# Patient Record
Sex: Female | Born: 1970 | Race: Black or African American | Hispanic: No | Smoking: Never smoker
Health system: Southern US, Community
[De-identification: ages and names within clinical notes are randomized; demographics above are authoritative.]

## PROBLEM LIST (undated history)

## (undated) DIAGNOSIS — I10 Essential (primary) hypertension: Secondary | ICD-10-CM

---

## 1997-12-07 ENCOUNTER — Other Ambulatory Visit: Admission: RE | Admit: 1997-12-07 | Discharge: 1997-12-07 | Payer: Self-pay | Admitting: *Deleted

## 1997-12-21 ENCOUNTER — Other Ambulatory Visit: Admission: RE | Admit: 1997-12-21 | Discharge: 1997-12-21 | Payer: Self-pay | Admitting: *Deleted

## 1998-01-02 ENCOUNTER — Emergency Department (HOSPITAL_COMMUNITY): Admission: EM | Admit: 1998-01-02 | Discharge: 1998-01-02 | Payer: Self-pay | Admitting: Emergency Medicine

## 1998-02-10 ENCOUNTER — Other Ambulatory Visit: Admission: RE | Admit: 1998-02-10 | Discharge: 1998-02-10 | Payer: Self-pay | Admitting: *Deleted

## 1998-05-02 ENCOUNTER — Encounter: Admission: RE | Admit: 1998-05-02 | Discharge: 1998-05-02 | Payer: Self-pay | Admitting: *Deleted

## 2000-07-16 ENCOUNTER — Emergency Department (HOSPITAL_COMMUNITY): Admission: EM | Admit: 2000-07-16 | Discharge: 2000-07-16 | Payer: Self-pay

## 2001-02-01 ENCOUNTER — Emergency Department (HOSPITAL_COMMUNITY): Admission: EM | Admit: 2001-02-01 | Discharge: 2001-02-01 | Payer: Self-pay | Admitting: Emergency Medicine

## 2003-02-23 ENCOUNTER — Encounter: Payer: Self-pay | Admitting: Family Medicine

## 2003-02-23 ENCOUNTER — Ambulatory Visit (HOSPITAL_COMMUNITY): Admission: RE | Admit: 2003-02-23 | Discharge: 2003-02-23 | Payer: Self-pay | Admitting: Family Medicine

## 2003-05-04 ENCOUNTER — Ambulatory Visit (HOSPITAL_COMMUNITY): Admission: RE | Admit: 2003-05-04 | Discharge: 2003-05-04 | Payer: Self-pay | Admitting: Family Medicine

## 2003-05-04 ENCOUNTER — Encounter: Payer: Self-pay | Admitting: Family Medicine

## 2003-08-03 ENCOUNTER — Emergency Department (HOSPITAL_COMMUNITY): Admission: EM | Admit: 2003-08-03 | Discharge: 2003-08-03 | Payer: Self-pay | Admitting: Emergency Medicine

## 2005-11-21 ENCOUNTER — Emergency Department (HOSPITAL_COMMUNITY): Admission: EM | Admit: 2005-11-21 | Discharge: 2005-11-21 | Payer: Self-pay | Admitting: Family Medicine

## 2005-12-26 ENCOUNTER — Emergency Department (HOSPITAL_COMMUNITY): Admission: EM | Admit: 2005-12-26 | Discharge: 2005-12-26 | Payer: Self-pay | Admitting: Family Medicine

## 2006-03-27 ENCOUNTER — Emergency Department (HOSPITAL_COMMUNITY): Admission: EM | Admit: 2006-03-27 | Discharge: 2006-03-27 | Payer: Self-pay | Admitting: Emergency Medicine

## 2006-10-22 ENCOUNTER — Emergency Department (HOSPITAL_COMMUNITY): Admission: EM | Admit: 2006-10-22 | Discharge: 2006-10-22 | Payer: Self-pay | Admitting: Family Medicine

## 2006-12-21 ENCOUNTER — Emergency Department (HOSPITAL_COMMUNITY): Admission: EM | Admit: 2006-12-21 | Discharge: 2006-12-21 | Payer: Self-pay | Admitting: Emergency Medicine

## 2008-01-26 ENCOUNTER — Encounter (INDEPENDENT_AMBULATORY_CARE_PROVIDER_SITE_OTHER): Payer: Self-pay | Admitting: Obstetrics and Gynecology

## 2008-01-26 ENCOUNTER — Ambulatory Visit (HOSPITAL_COMMUNITY): Admission: RE | Admit: 2008-01-26 | Discharge: 2008-01-26 | Payer: Self-pay | Admitting: Obstetrics and Gynecology

## 2008-09-19 ENCOUNTER — Ambulatory Visit: Payer: Self-pay | Admitting: Internal Medicine

## 2008-09-19 DIAGNOSIS — I10 Essential (primary) hypertension: Secondary | ICD-10-CM | POA: Insufficient documentation

## 2008-09-19 DIAGNOSIS — F329 Major depressive disorder, single episode, unspecified: Secondary | ICD-10-CM | POA: Insufficient documentation

## 2008-09-19 DIAGNOSIS — F3289 Other specified depressive episodes: Secondary | ICD-10-CM | POA: Insufficient documentation

## 2008-09-19 DIAGNOSIS — R0989 Other specified symptoms and signs involving the circulatory and respiratory systems: Secondary | ICD-10-CM | POA: Insufficient documentation

## 2008-09-19 DIAGNOSIS — G4710 Hypersomnia, unspecified: Secondary | ICD-10-CM | POA: Insufficient documentation

## 2008-09-19 DIAGNOSIS — G43009 Migraine without aura, not intractable, without status migrainosus: Secondary | ICD-10-CM | POA: Insufficient documentation

## 2008-09-19 DIAGNOSIS — R079 Chest pain, unspecified: Secondary | ICD-10-CM | POA: Insufficient documentation

## 2008-09-19 DIAGNOSIS — R0609 Other forms of dyspnea: Secondary | ICD-10-CM | POA: Insufficient documentation

## 2008-09-19 DIAGNOSIS — D509 Iron deficiency anemia, unspecified: Secondary | ICD-10-CM | POA: Insufficient documentation

## 2008-09-19 LAB — CONVERTED CEMR LAB
ALT: 17 units/L (ref 0–35)
AST: 16 units/L (ref 0–37)
Albumin: 3.7 g/dL (ref 3.5–5.2)
Alkaline Phosphatase: 80 units/L (ref 39–117)
BUN: 10 mg/dL (ref 6–23)
Basophils Absolute: 0 10*3/uL (ref 0.0–0.1)
Basophils Relative: 0.8 % (ref 0.0–3.0)
Bilirubin Urine: NEGATIVE
Bilirubin, Direct: 0.1 mg/dL (ref 0.0–0.3)
CO2: 31 meq/L (ref 19–32)
Calcium: 9.4 mg/dL (ref 8.4–10.5)
Chloride: 104 meq/L (ref 96–112)
Cholesterol: 152 mg/dL (ref 0–200)
Creatinine, Ser: 0.6 mg/dL (ref 0.4–1.2)
Eosinophils Absolute: 0 10*3/uL (ref 0.0–0.7)
Eosinophils Relative: 0.5 % (ref 0.0–5.0)
GFR calc Af Amer: 145 mL/min
GFR calc non Af Amer: 120 mL/min
Glucose, Bld: 81 mg/dL (ref 70–99)
HCT: 36.3 % (ref 36.0–46.0)
HDL: 39 mg/dL (ref >39.0)
Hemoglobin, Urine: NEGATIVE
Hemoglobin: 12.2 g/dL (ref 12.0–15.0)
Ketones, ur: NEGATIVE mg/dL
LDL Cholesterol: 98 mg/dL (ref 0–99)
Leukocytes, UA: NEGATIVE
Lymphocytes Relative: 46.3 % — ABNORMAL HIGH (ref 12.0–46.0)
MCHC: 33.5 g/dL (ref 30.0–36.0)
MCV: 78.1 fL (ref 78.0–100.0)
Monocytes Absolute: 0.5 10*3/uL (ref 0.1–1.0)
Monocytes Relative: 10.4 % (ref 3.0–12.0)
Neutro Abs: 2 10*3/uL (ref 1.4–7.7)
Neutrophils Relative %: 42 % — ABNORMAL LOW (ref 43.0–77.0)
Nitrite: NEGATIVE
Platelets: 345 10*3/uL (ref 150–400)
Potassium: 4 meq/L (ref 3.5–5.1)
RBC: 4.65 M/uL (ref 3.87–5.11)
RDW: 15.2 % — ABNORMAL HIGH (ref 11.5–14.6)
Sodium: 139 meq/L (ref 135–145)
Specific Gravity, Urine: 1.02 (ref 1.000–1.03)
TSH: 0.57 microintl units/mL (ref 0.35–5.50)
Total Bilirubin: 0.7 mg/dL (ref 0.3–1.2)
Total CHOL/HDL Ratio: 3.9
Total Protein, Urine: NEGATIVE mg/dL
Total Protein: 7.9 g/dL (ref 6.0–8.3)
Triglycerides: 75 mg/dL (ref 0–149)
Urine Glucose: NEGATIVE mg/dL
Urobilinogen, UA: 1 (ref 0.0–1.0)
VLDL: 15 mg/dL (ref 0–40)
WBC: 4.7 10*3/uL (ref 4.5–10.5)
pH: 6 (ref 5.0–8.0)

## 2008-09-28 ENCOUNTER — Ambulatory Visit: Payer: Self-pay

## 2008-09-28 ENCOUNTER — Encounter: Payer: Self-pay | Admitting: Internal Medicine

## 2008-09-28 DIAGNOSIS — I428 Other cardiomyopathies: Secondary | ICD-10-CM | POA: Insufficient documentation

## 2008-10-03 ENCOUNTER — Ambulatory Visit: Payer: Self-pay

## 2008-10-10 ENCOUNTER — Ambulatory Visit: Payer: Self-pay | Admitting: Cardiology

## 2008-10-17 ENCOUNTER — Ambulatory Visit: Payer: Self-pay | Admitting: Gastroenterology

## 2008-10-24 ENCOUNTER — Ambulatory Visit (HOSPITAL_COMMUNITY): Admission: RE | Admit: 2008-10-24 | Discharge: 2008-10-24 | Payer: Self-pay | Admitting: Internal Medicine

## 2008-10-24 ENCOUNTER — Ambulatory Visit: Payer: Self-pay | Admitting: Internal Medicine

## 2008-10-25 ENCOUNTER — Ambulatory Visit: Payer: Self-pay | Admitting: Cardiology

## 2008-11-14 ENCOUNTER — Ambulatory Visit: Payer: Self-pay | Admitting: Internal Medicine

## 2008-11-14 DIAGNOSIS — I119 Hypertensive heart disease without heart failure: Secondary | ICD-10-CM | POA: Insufficient documentation

## 2008-11-28 ENCOUNTER — Ambulatory Visit: Payer: Self-pay

## 2008-11-28 ENCOUNTER — Ambulatory Visit: Payer: Self-pay | Admitting: Cardiology

## 2008-11-28 LAB — CONVERTED CEMR LAB
BUN: 12 mg/dL (ref 6–23)
CO2: 27 meq/L (ref 19–32)
Calcium: 9.2 mg/dL (ref 8.4–10.5)
Chloride: 109 meq/L (ref 96–112)
Creatinine, Ser: 0.8 mg/dL (ref 0.4–1.2)
GFR calc non Af Amer: 104.14 mL/min (ref >60)
Glucose, Bld: 110 mg/dL — ABNORMAL HIGH (ref 70–99)
Potassium: 3.9 meq/L (ref 3.5–5.1)
Sodium: 139 meq/L (ref 135–145)

## 2008-12-09 ENCOUNTER — Telehealth: Payer: Self-pay | Admitting: Cardiology

## 2008-12-09 ENCOUNTER — Telehealth (INDEPENDENT_AMBULATORY_CARE_PROVIDER_SITE_OTHER): Payer: Self-pay | Admitting: *Deleted

## 2008-12-29 ENCOUNTER — Encounter (INDEPENDENT_AMBULATORY_CARE_PROVIDER_SITE_OTHER): Payer: Self-pay | Admitting: *Deleted

## 2009-01-16 ENCOUNTER — Ambulatory Visit: Payer: Self-pay

## 2009-01-16 ENCOUNTER — Encounter: Payer: Self-pay | Admitting: Cardiology

## 2009-01-18 ENCOUNTER — Telehealth (INDEPENDENT_AMBULATORY_CARE_PROVIDER_SITE_OTHER): Payer: Self-pay | Admitting: *Deleted

## 2009-01-26 ENCOUNTER — Encounter: Admission: RE | Admit: 2009-01-26 | Discharge: 2009-04-26 | Payer: Self-pay | Admitting: Cardiology

## 2009-02-21 ENCOUNTER — Encounter: Payer: Self-pay | Admitting: Cardiology

## 2010-09-16 ENCOUNTER — Encounter: Payer: Self-pay | Admitting: Cardiology

## 2010-09-17 ENCOUNTER — Encounter: Payer: Self-pay | Admitting: Cardiology

## 2011-01-08 NOTE — Assessment & Plan Note (Signed)
[facility]                            CARDIOLOGY OFFICE NOTE   Michele Cole, Michele Cole                      MRN:          [id number]  DATE:10/10/2008                            DOB:          1970-10-04    ADDENDUM   ASSESSMENT AND PLAN:  1. Daytime sleepiness and morning headache.  The patient does have a      body habitus suggestive of obstructive sleep apnea.  She does snore      loudly at night and has daytime sleepiness and morning headache.  I      am going to set her up to get a sleep study.  If she does have      sleep apnea, I suggest her to be on CPAP, this may help with her      blood pressure also.     Marca Ancona, MD     DM/MedQ  DD: 10/10/2008  DT: 10/10/2008  Job #: [id number]

## 2011-01-08 NOTE — Assessment & Plan Note (Signed)
[facility]                            CARDIOLOGY OFFICE NOTE   Michele, Cole                      MRN:          [id number]  DATE:10/10/2008                            DOB:          09/06/1970    PRIMARY CARE PHYSICIAN:  Corwin Levins, MD at [facility], [place].   HISTORY OF PRESENT ILLNESS:  This is a 40 year old with a history of  morbid obesity, hypertension, and depression who presents for evaluation  of atypical chest pain and shortness of breath.  She has had a recent  echocardiogram and adenosine Myoview.  The patient states that for a  long period of time, she has had some shortness of breath.  She has 3  flights of stairs to climb to her apartment.  She is quite short of  breath by the time she gets to the top.  She is not short of breath  walking on a flat ground.  However, if she has to go up a hill she does  get considerably short of breath.  She also had 2 episodes of left-sided  chest burning and shortness of breath.  They were quite severe, 1 was in  September 2009 and the other was in December 2009, both times she was in  a meeting and was under a significant amount of stress.  Both of these  lasted only for a few seconds and were associated with tingling of her  left side.  She has had no further episodes.  She did have an adenosine  Myoview that showed EF of 54% with breast shadow, but no evidence of  scar or ischemia.  She had an echocardiogram that also showed an EF of  50-55% with mild global hypokinesis and abnormal septal motion.  The RV  was normal in size and function.  There were are no significant valvular  abnormalities.  The patient does say that she has not been exercising  lately.  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  She did go over to [employer] to an informational meeting about  the Lap-Band procedure.  However, apparently you have to have obesity  documented for 5 years and she has  only been seeing a doctor for a  couple of years.  She does have headaches in the morning and feels tired  and sleepy during the day.  She says she has been snoring loudly at  night for the last few years.   PAST MEDICAL HISTORY:  1. Hypertension.  2. Morbid obesity.  3. 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  4. Depression.  5. Migraines.  6. Echocardiogram done of February 2010 showed EF of 50-55% with mild      global hyperkinesis.  There appeared to be abnormal septal motion.      The RV appeared normal in size and function.  There were no      significant valvular abnormalities.  7. The patient had adenosine Myoview in February 2010, EF was 54%,      there was breast shadow.  There was  no evidence of scar or      ischemia.   SOCIAL HISTORY:  The patient is divorced.  She has 2 children.  She  works for the [employer].  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  She does not smoke and does not using  alcohol.   FAMILY HISTORY:  The patient's mother died of coronary artery disease in  the age of 38.  She actually had her first MI at 30.  Father had 2 heart  attacks.  She is not sure what age.  She has 3 siblings, were healthy  except for hypertension.   REVIEW OF SYSTEMS:  Negative except as noted in the history of present  illness.   LABORATORY DATA:  On February 2010, LDL 98, HDL 39, hematocrit 36.3, and  creatinine 0.6.  TSH is normal.  LFTs are also normal.   PHYSICAL EXAMINATION:  VITAL SIGNS:  Blood pressure is 146/105, heart  rate is 72 and regular, weight is 382 pounds.  GENERAL:  This is an obese female in no apparent distress.  NEUROLOGIC:  Alert and oriented x3.  LUNGS:  Clear to auscultation bilaterally.  Normal respiratory effort.  CARDIOVASCULAR:  Heart is regular.  S1 and S2.  No S3.  No S4.  There is  no murmur.  EXTREMITIES:  There is 1+ peripheral edema about a quarter of the way up  to lower legs bilaterally.  There are  2+ posterior tibial pulses  bilaterally.  There is no carotid bruit.  NECK:  There is no JVD.  There is no thyromegaly or thyroid nodule.  HEENT:  Normal exam.  ABDOMEN:  Soft, obese, and nontender.  No hepatosplenomegaly.  Normal  bowel sounds.  EXTREMITIES:  There is no clubbing or cyanosis.  MUSCULOSKELETAL:  Normal exam.  SKIN:  Normal exam.   KG was reviewed shows normal sinus rhythm.  There is poor anterior R-  wave progression.   ASSESSMENT AND PLAN:  This is a 40 year old morbidly obese female with  hypertension who presents for evaluation of shortness of breath and  atypical chest pain.  1. Atypical chest pain.  The patient does have risk factors including      hypertension and family history of early onset coronary artery      disease.  However, her chest pain was quite atypical and associated      with emotional stress.  I do think her chest pain is probably      noncardiac and she had an adenosine Myoview recently that showed no      evidence of scar or ischemia.  Given her early family history, a      reasonable goal for her LDL would be less than 100 and her LDL is      98.  The next time she has lipids checked, I do think it would be      reasonable to check a lipoprotein A.  If that is elevated, I would      treat her LDL to an even lower level.  2. Hypertension.  The patient's blood pressure continues to be      elevated.  It has been elevated at her primary care physician's      office.  I am going to go ahead and start her on medication today.      We will begin Norvasc at 5 mg daily.  We will have her back in 2  weeks for blood pressure check.  3. Dyspnea on exertion.  I do think that the patient's dyspnea on      exertion is mainly due to her weight.  She weighs 382 pounds and is      morbidly obese.  I did talk to her at length about strategies for      diet and exercise, it is difficult for her to exercise as her life      is quite busy.  She is in a  school at [employer] and she is also      working.  She does drink a lot of soft drinks and eats fast food.      I did tell her she needs to cut those both out completely.  I do      think something like a Lap-Band or gastric bypass maybe beneficial      for her especially given her risk factors for having significant      chronic problems developing as a result of her obesity in the      future.  I did tell her that it would be quite reasonable to look      into the Lap-Band, although she is discouraged right now because      they say that you have to have obesity documented for 5 years.  4. Abnormal echocardiogram.  The patient's left ventricular function      was mildly depressed on her echocardiogram.  Valvular function      appeared normal.  She has no evidence of coronary artery disease on      her Myoview.  I do think that her shortness of breath comes from      her obesity rather than from any heart problems.  We will give her      3 months for treat her blood pressure and we will do a cardiac MRI      to quantitatively assess her left ventricular function and also to      assess for any infiltrative disease although I doubt we will find      this.  We will see the patient back in 3 months after she gets her      MRI done.  5. Daytime sleepiness and morning headache.  The patient does have a      body habitus suggestive of obstructive sleep apnea.  She does snore      loudly at night and has daytime sleepiness and morning headache.  I      am going to set her up to get a sleep study.  If she does have      sleep apnea, I would suggest for her to be on CPAP, this may help      with her blood pressure also.     Marca Ancona, MD  Electronically Signed    DM/MedQ  DD: 10/10/2008  DT: 10/10/2008  Job #: [id number]   cc:   Corwin Levins, MD

## 2011-01-08 NOTE — Op Note (Signed)
Michele Cole, Michele Cole               ACCOUNT NO.:  [id number]   MEDICAL RECORD NO.:  [id number]           PATIENT TYPE:   LOCATION:                                [facility]   PHYSICIAN:  Lenoard Aden, M.D.     DATE OF BIRTH:   DATE OF PROCEDURE:  01/26/2008  DATE OF DISCHARGE:  01/26/2008                               OPERATIVE REPORT   PREOPERATIVE DIAGNOSES:  Dysmenorrhea and menorrhagia.   POSTOPERATIVE DIAGNOSES:  Dysmenorrhea and menorrhagia.   PROCEDURE:  Diagnostic hysteroscopy, dilation and curettage, and  NovaSure endometrial ablation.   SURGEON:  Lenoard Aden, M.D.   ANESTHESIA:  General.   ESTIMATED BLOOD LOSS:  Less than 50 mL.   COMPLICATIONS:  None.   DRAINS:  None.   COUNTS:  Correct.   CONDITION:  The patient recovered in good condition.   FLUID DEFICIT:  55 mL.   BRIEF OPERATIVE NOTE:  After being apprised of risks of anesthesia,  infection, bleeding, injury to abdominal organs, need for repair,  delayed versus immediate, and  complications to include bowel and  bladder injury, the patient was brought to the operating where she was  administered general anesthetic without complications, prepped and  draped in sterile fashion, catheterized till the bladder was empty.  Exam under anesthesia reveals a small anteflexed uterus.  No adnexal  masses.  Uterus sounds to 9 cm, intracervical length of 3 cm.  Cervix  easily dilated with #21 Shawnie Pons dilator.  Hysteroscope placed after  placement of a dilute Pitressin solution at 3 and 9 o'clock at the  cervicovaginal junction, 16 mL total.  Visualization reveals a normal  endometrial cavity with thickened posterior wall.  Endometrial  curettings were collected with normal __________ quadrant method and  tissues with copious amounts of tissue noted, and sent to pathology.  Normal tubal ostia and normal cavity are revisualized.  NovaSure device  was placed.  Cavity width of 6 cm is presented.  CO2 test is  initiated  and is negative.  Procedure is then initiated for a cavity width of 4.3  for 65 seconds without complications.  Instrument is removed without  difficulty.  Revisualization reveals a well-ablated endometrial cavity and bilateral  good anterior and posterior wall coverage.  Instruments removed without  difficulty.  The patient tolerated the procedure well, and was awakened  and transferred to the recovery room in good condition.      Lenoard Aden, M.D.  Electronically Signed     RJT/MEDQ  D:  01/26/2008  T:  01/27/2008  Job:  [id number]

## 2011-01-08 NOTE — Op Note (Signed)
NAME:  Michele Cole, Michele Cole                  ACCOUNT NO.:  I l   MEDICAL RECORD NO.:  [id number]           PATIENT TYPE:   LOCATION:                                 FACILITY:   PHYSICIAN:  Lenoard Aden, M.D.     DATE OF BIRTH:   DATE OF PROCEDURE:  01/26/2008  DATE OF DISCHARGE:                               OPERATIVE REPORT   PREOPERATIVE DIAGNOSES:  Dysmenorrhea and menorrhagia.   POSTOPERATIVE DIAGNOSES:  Dysmenorrhea and menorrhagia.   PROCEDURE:  Diagnostic hysteroscopy, dilation and curettage, and  NovaSure endometrial ablation.   SURGEON:  Lenoard Aden, M.D.   ANESTHESIA:  General.   ESTIMATED BLOOD LOSS:  Less than 50 mL.   COMPLICATIONS:  None.   DRAINS:  None.   COUNTS:  Correct.   CONDITION:  The patient recovered in good condition.   FLUID DEFICIT:  55 mL.   BRIEF OPERATIVE NOTE:  After being apprised of risks of anesthesia,  infection, bleeding, injury to abdominal organs, need for repair,  delayed versus immediate, and  complications to include bowel and  bladder injury, the patient was brought to the operating where she was  administered general anesthetic without complications, prepped and  draped in sterile fashion, catheterized till the bladder was empty.  Exam under anesthesia reveals a small anteflexed uterus.  No adnexal  masses.  Uterus sounds to 9 cm, intracervical length of 3 cm.  Cervix  easily dilated with #21 Shawnie Pons dilator.  Hysteroscope placed after  placement of a dilute Pitressin solution at 3 and 9 o'clock at the  cervicovaginal junction, 16 mL total.  Visualization reveals a normal  endometrial cavity with thickened posterior wall.  Endometrial  curettings were collected with normal __________ quadrant method and  tissues with copious amounts of tissue noted, and sent to pathology.  Normal tubal ostia and normal cavity are revisualized.  NovaSure device  was placed.  Cavity width of 6 cm is presented.  CO2 test is initiated  and is  negative.  Procedure is then initiated for a cavity width of 4.3  for 65 seconds without complications.  Instrument is removed without  difficulty.  Revisualization reveals a well-ablated endometrial cavity and bilateral  good anterior and posterior wall coverage.  Instruments removed without  difficulty.  The patient tolerated the procedure well, and was awakened  and transferred to the recovery room in good condition.      Lenoard Aden, M.D.     RJT/MEDQ  D:  01/26/2008  T:  01/27/2008  Job:  [id number]

## 2011-05-23 LAB — CBC
HCT: 36
Hemoglobin: 11.9 — ABNORMAL LOW
MCHC: 32.9
MCV: 74.3 — ABNORMAL LOW
Platelets: 373
RBC: 4.84
RDW: 16.7 — ABNORMAL HIGH
WBC: 5.1

## 2011-05-23 LAB — PREGNANCY, URINE: Preg Test, Ur: NEGATIVE

## 2012-12-20 ENCOUNTER — Encounter (HOSPITAL_COMMUNITY): Payer: Self-pay | Admitting: *Deleted

## 2012-12-20 ENCOUNTER — Emergency Department (HOSPITAL_COMMUNITY)
Admission: EM | Admit: 2012-12-20 | Discharge: 2012-12-20 | Disposition: A | Discharge status: Home / Self Care | Payer: BC Managed Care – PPO | Attending: Emergency Medicine | Admitting: Emergency Medicine

## 2012-12-20 ENCOUNTER — Emergency Department (HOSPITAL_COMMUNITY): Payer: BC Managed Care – PPO

## 2012-12-20 DIAGNOSIS — R1084 Generalized abdominal pain: Secondary | ICD-10-CM | POA: Insufficient documentation

## 2012-12-20 DIAGNOSIS — Z79899 Other long term (current) drug therapy: Secondary | ICD-10-CM | POA: Insufficient documentation

## 2012-12-20 DIAGNOSIS — R197 Diarrhea, unspecified: Secondary | ICD-10-CM | POA: Insufficient documentation

## 2012-12-20 DIAGNOSIS — Z3202 Encounter for pregnancy test, result negative: Secondary | ICD-10-CM | POA: Insufficient documentation

## 2012-12-20 DIAGNOSIS — I10 Essential (primary) hypertension: Secondary | ICD-10-CM | POA: Insufficient documentation

## 2012-12-20 DIAGNOSIS — R112 Nausea with vomiting, unspecified: Secondary | ICD-10-CM | POA: Insufficient documentation

## 2012-12-20 HISTORY — DX: Essential (primary) hypertension: I10

## 2012-12-20 LAB — COMPREHENSIVE METABOLIC PANEL
ALT: 16 U/L (ref 0–35)
AST: 15 U/L (ref 0–37)
Albumin: 3.6 g/dL (ref 3.5–5.2)
Alkaline Phosphatase: 91 U/L (ref 39–117)
BUN: 10 mg/dL (ref 6–23)
CO2: 25 mEq/L (ref 19–32)
Calcium: 9.5 mg/dL (ref 8.4–10.5)
Chloride: 101 mEq/L (ref 96–112)
Creatinine, Ser: 0.55 mg/dL (ref 0.50–1.10)
GFR calc Af Amer: >90 mL/min (ref >90)
GFR calc non Af Amer: >90 mL/min (ref >90)
Glucose, Bld: 117 mg/dL — ABNORMAL HIGH (ref 70–99)
Potassium: 3.7 mEq/L (ref 3.5–5.1)
Sodium: 135 mEq/L (ref 135–145)
Total Bilirubin: 0.3 mg/dL (ref 0.3–1.2)
Total Protein: 8.3 g/dL (ref 6.0–8.3)

## 2012-12-20 LAB — OCCULT BLOOD, POC DEVICE: Fecal Occult Bld: POSITIVE — AB

## 2012-12-20 LAB — URINALYSIS, MICROSCOPIC ONLY
Bilirubin Urine: NEGATIVE
Glucose, UA: NEGATIVE mg/dL
Hgb urine dipstick: NEGATIVE
Ketones, ur: NEGATIVE mg/dL
Leukocytes, UA: NEGATIVE
Nitrite: NEGATIVE
Protein, ur: NEGATIVE mg/dL
Specific Gravity, Urine: 1.013 (ref 1.005–1.030)
Urobilinogen, UA: 0.2 mg/dL (ref 0.0–1.0)
pH: 7 (ref 5.0–8.0)

## 2012-12-20 LAB — CBC WITH DIFFERENTIAL/PLATELET
Basophils Absolute: 0 10*3/uL (ref 0.0–0.1)
Basophils Relative: 0 % (ref 0–1)
Eosinophils Absolute: 0 10*3/uL (ref 0.0–0.7)
Eosinophils Relative: 0 % (ref 0–5)
HCT: 38.5 % (ref 36.0–46.0)
Hemoglobin: 13 g/dL (ref 12.0–15.0)
Lymphocytes Relative: 24 % (ref 12–46)
Lymphs Abs: 1.5 10*3/uL (ref 0.7–4.0)
MCH: 26.4 pg (ref 26.0–34.0)
MCHC: 33.8 g/dL (ref 30.0–36.0)
MCV: 78.3 fL (ref 78.0–100.0)
Monocytes Absolute: 0.3 10*3/uL (ref 0.1–1.0)
Monocytes Relative: 4 % (ref 3–12)
Neutro Abs: 4.5 10*3/uL (ref 1.7–7.7)
Neutrophils Relative %: 72 % (ref 43–77)
Platelets: 372 10*3/uL (ref 150–400)
RBC: 4.92 MIL/uL (ref 3.87–5.11)
RDW: 14.5 % (ref 11.5–15.5)
WBC: 6.4 10*3/uL (ref 4.0–10.5)

## 2012-12-20 LAB — POCT PREGNANCY, URINE: Preg Test, Ur: NEGATIVE

## 2012-12-20 LAB — LIPASE, BLOOD: Lipase: 22 U/L (ref 11–59)

## 2012-12-20 MED ORDER — FENTANYL CITRATE 0.05 MG/ML IJ SOLN
50.0000 ug | Freq: Once | INTRAMUSCULAR | Status: AC
  Administered 2012-12-20: 50 ug via INTRAVENOUS
  Filled 2012-12-20: qty 2

## 2012-12-20 MED ORDER — HYDROCODONE-ACETAMINOPHEN 5-325 MG PO TABS: 1.0000 | ORAL_TABLET | ORAL | Status: DC | PRN

## 2012-12-20 MED ORDER — ONDANSETRON HCL 4 MG/2ML IJ SOLN
4.0000 mg | Freq: Once | INTRAMUSCULAR | Status: AC
  Administered 2012-12-20: 4 mg via INTRAVENOUS
  Filled 2012-12-20: qty 2

## 2012-12-20 MED ORDER — MORPHINE SULFATE 4 MG/ML IJ SOLN
6.0000 mg | Freq: Once | INTRAMUSCULAR | Status: AC
  Administered 2012-12-20: 6 mg via INTRAVENOUS
  Filled 2012-12-20: qty 2

## 2012-12-20 MED ORDER — IOHEXOL 300 MG/ML  SOLN
50.0000 mL | Freq: Once | INTRAMUSCULAR | Status: AC | PRN
  Administered 2012-12-20: 50 mL via ORAL

## 2012-12-20 MED ORDER — IOHEXOL 300 MG/ML  SOLN
100.0000 mL | Freq: Once | INTRAMUSCULAR | Status: AC | PRN
  Administered 2012-12-20: 100 mL via INTRAVENOUS

## 2012-12-20 MED ORDER — SODIUM CHLORIDE 0.9 % IV SOLN
1000.0000 mL | Freq: Once | INTRAVENOUS | Status: AC
  Administered 2012-12-20: 1000 mL via INTRAVENOUS

## 2012-12-20 MED ORDER — ONDANSETRON 8 MG PO TBDP: 8.0000 mg | ORAL_TABLET | Freq: Three times a day (TID) | ORAL | Status: DC | PRN

## 2012-12-20 NOTE — ED Notes (Signed)
Pt unable to tolerate fluids. Sts after taking a couple sips, she became nauseous and started having abd pain.

## 2012-12-20 NOTE — ED Provider Notes (Addendum)
History     CSN: [id number]  Arrival date & time 12/20/12  0911   First MD Initiated Contact with Patient 12/20/12 0932      Chief Complaint  Patient presents with  . Nausea  . Abdominal Pain  . Emesis     HPI Patient reports nausea vomiting diarrhea over the past 24 hours.  She does state that she saw a small amount of blood the nurse stool.  No melena described.  No fevers or chills.  Patient is concerned she may have gotten food poisoning.  No recent sick contacts.  She does report generalized crampy abdominal pain but reports more of the abdominal pain is located in her lower abdomen.  No urinary symptoms.  Nothing worsens or improves her symptoms.  Her pain is mild to moderate in severity   Past Medical History  Diagnosis Date  . Hypertension     Past Surgical History  Procedure Laterality Date  . Cesarean section      No family history on file.  History  Substance Use Topics  . Smoking status: Never Smoker   . Smokeless tobacco: Not on file  . Alcohol Use: No    OB History   Grav Para Term Preterm Abortions TAB SAB Ect Mult Living                  Review of Systems  All other systems reviewed and are negative.    Allergies  Review of patient's allergies indicates no known allergies.  Home Medications   Current Outpatient Rx  Name  Route  Sig  Dispense  Refill  . ibuprofen (ADVIL,MOTRIN) 200 MG tablet   Oral   Take 400-800 mg by mouth every 8 (eight) hours as needed for pain.         Marland Kitchen lisinopril-hydrochlorothiazide (PRINZIDE,ZESTORETIC) 20-12.5 MG per tablet   Oral   Take 1 tablet by mouth daily.           BP 155/89  Pulse 81  Temp(Src) 98.2 F (36.8 C) (Oral)  Resp 16  SpO2 98%  Physical Exam  Nursing note and vitals reviewed. Constitutional: She is oriented to person, place, and time. She appears well-developed and well-nourished. No distress.  HENT:  Head: Normocephalic and atraumatic.  Eyes: EOM are normal.  Neck: Normal  range of motion.  Cardiovascular: Normal rate, regular rhythm and normal heart sounds.   Pulmonary/Chest: Effort normal and breath sounds normal.  Abdominal: Soft. She exhibits no distension. There is no tenderness. There is no rebound and no guarding.  Genitourinary:  Nontender rectal exam.  No gross blood.  Brown stool.  Musculoskeletal: Normal range of motion.  Neurological: She is alert and oriented to person, place, and time.  Skin: Skin is warm and dry.  Psychiatric: She has a normal mood and affect. Judgment normal.    ED Course  Procedures (including critical care time)  Labs Reviewed  COMPREHENSIVE METABOLIC PANEL - Abnormal; Notable for the following:    Glucose, Bld 117 (*)    All other components within normal limits  URINALYSIS, MICROSCOPIC ONLY - Abnormal; Notable for the following:    Bacteria, UA FEW (*)    Squamous Epithelial / LPF FEW (*)    All other components within normal limits  OCCULT BLOOD, POC DEVICE - Abnormal; Notable for the following:    Fecal Occult Bld POSITIVE (*)    All other components within normal limits  CBC WITH DIFFERENTIAL  LIPASE, BLOOD  POCT PREGNANCY,  URINE   Ct Abdomen Pelvis W Contrast  12/20/2012  *RADIOLOGY REPORT*  Clinical Data: Nausea, vomiting, abdominal pain  CT ABDOMEN AND PELVIS WITH CONTRAST  Technique:  Multidetector CT imaging of the abdomen and pelvis was performed following the standard protocol during bolus administration of intravenous contrast.  Contrast: 50mL OMNIPAQUE IOHEXOL 300 MG/ML  SOLN, OMNIPAQUE IOHEXOL 300 MG/ML  SOLN  Comparison: None.  Findings:  Normal hepatic contour.  There is a minimal amount of focal fatty infiltration adjacent to the fissure for the ligamentum teres. Normal appearance of the gallbladder.  No intra or extrahepatic biliary duct dilatation.  No ascites.  There is symmetric enhancement of the kidneys.  There is mild persistent fetal lobulation of the bilateral kidneys.  No definite renal  stones on this postcontrast examination.  No discrete renal lesions.  No evidence of urinary obstruction or perinephric stranding.  Normal appearance of the bilateral adrenal glands, pancreas and spleen.  Incidental note is made of a small splenule.  Scattered colonic diverticulosis without evidence of diverticulitis.  There is a tiny linear appendicolith within the tip of otherwise normal appearing appendix (coronal image 62, series five).  No associated periappendiceal stranding.  No pneumoperitoneum, pneumatosis or portal venous gas.  Normal caliber of the abdominal aorta.  The major branch vessels of the abdominal aorta appear patent on this non CT examination.  No definite retroperitoneal, mesenteric, pelvic or inguinal lymphadenopathy.  Normal appearance of the pelvic organs for age.  Incidental note is made of an approximately 1.9 x 2.0 cm hypoattenuating right-sided adnexal cyst (image 80, series 2).  No free fluid in the pelvis.  Limited visualization of the lower thorax demonstrates minimal bibasilar subpleural atelectasis.  No focal airspace opacities. Normal heart size.  No pericardial effusion.  No acute or aggressive osseous abnormalities.  Mild DDD of L5 - S1 with disc space height loss and posterior disc osteophyte complex at this level.  IMPRESSION: 1.  No explanation for patient's abdominal pain, nausea and vomiting.  Specifically, no evidence of enteric urinary obstruction. 2.  A linear appendicolith is seen within the tip of an otherwise normal appearing appendix - nonspecific though in the absence of secondary findings is of doubtful clinical concern.  3.  Colonic diverticulosis without evidence of diverticulitis.   Original Report Authenticated By: Tacey Ruiz, MD    I personally reviewed the imaging tests through PACS system I reviewed available ER/hospitalization records through the EMR   1. Nausea vomiting and diarrhea       MDM  This patient's symptoms seem more consistent with  a viral gastroenteritis.  Given her mild lower abdominal discomfort a CT scan was obtained as she develop worsening pain and nausea with eating while in the emergency department.  CT scan is without acute pathology.  There is appendicolith noted however there is no surrounding stranding or inflammatory changes.  It's otherwise read out as a normal appendix.  The suspicion for appendicitis is low.  Patient will return to ER for new or worsening abdominal pain.  Home with nausea medicine a short course of pain medicine.        Lyanne Co, MD 12/20/12 1455  Lyanne Co, MD 12/20/12 [phone]

## 2012-12-20 NOTE — ED Notes (Signed)
Pt sts went out to eat yesterday and overnight developed nausea, diarrhea and vomiting. Pt sts she thinks she has food poisoning.

## 2013-09-30 ENCOUNTER — Encounter: Payer: Self-pay | Admitting: Internal Medicine

## 2013-11-20 IMAGING — CT CT ABD-PELV W/ CM
1 of 2 series · 14 of 32 positions shown, 18 images · IV contrast (omnipaque)
Comparison: None.

CLINICAL DATA: Nausea, vomiting, abdominal pain

CT ABDOMEN AND PELVIS WITH CONTRAST
TECHNIQUE: Multidetector CT imaging of the abdomen and pelvis was
performed following the standard protocol during bolus
administration of intravenous contrast.
Contrast: 50mL OMNIPAQUE IOHEXOL 300 MG/ML  SOLN, 100mL OMNIPAQUE
IOHEXOL 300 MG/ML  SOLN

[Series 2: abd/pel with · axial · 0.87mm/px · z∈[-487,-37]mm · 14 of 100 slices shown, 18 images]
[im 5/100  soft-tissue]
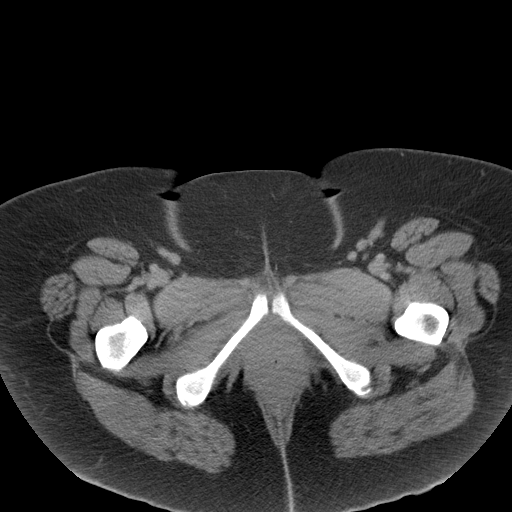
[im 5/100  bone]
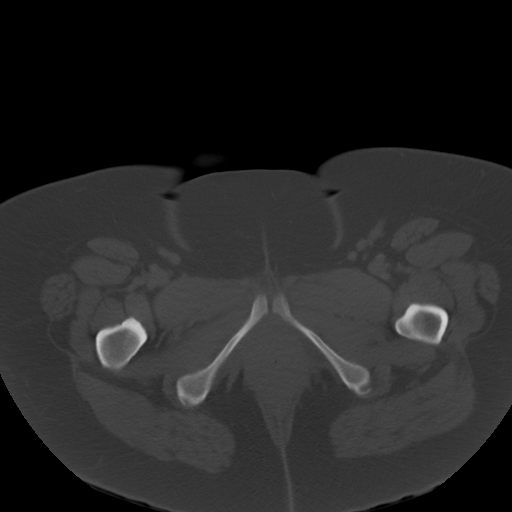
[im 13/100  soft-tissue]
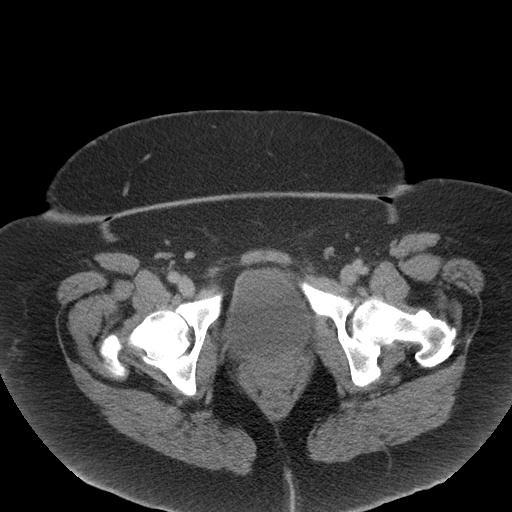
[im 21/100  soft-tissue]
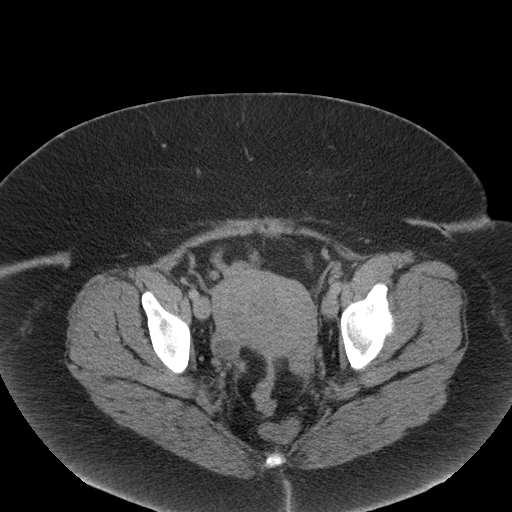
[im 29/100  soft-tissue]
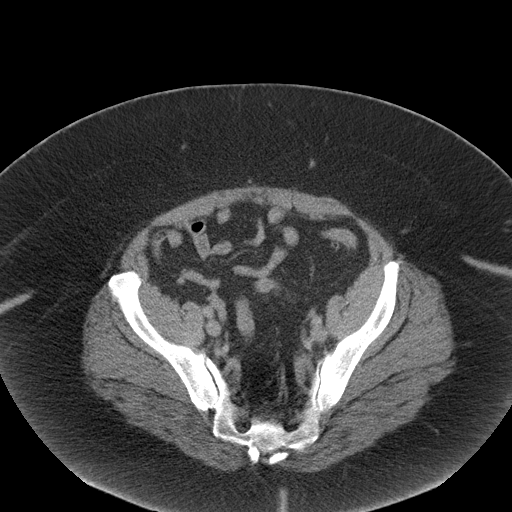
[im 38/100  soft-tissue]
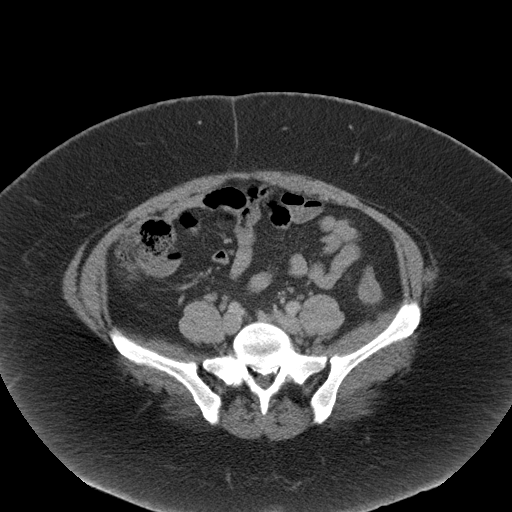
[im 46/100  soft-tissue]
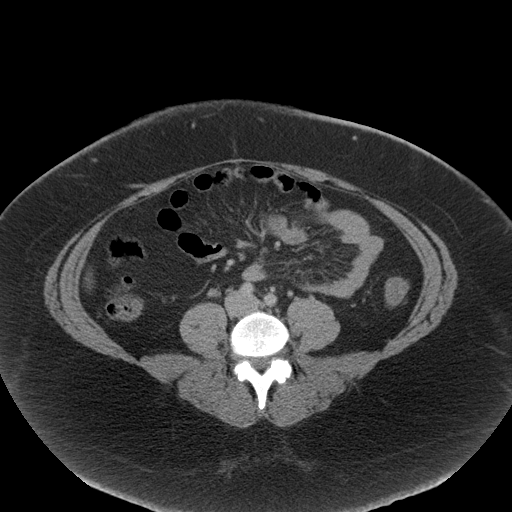
[im 54/100  soft-tissue]
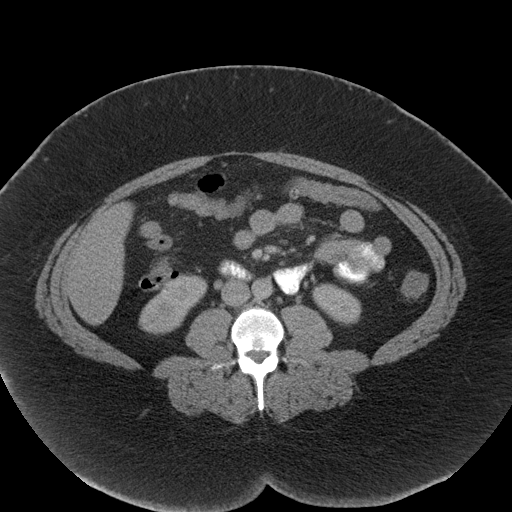
[im 62/100  soft-tissue]
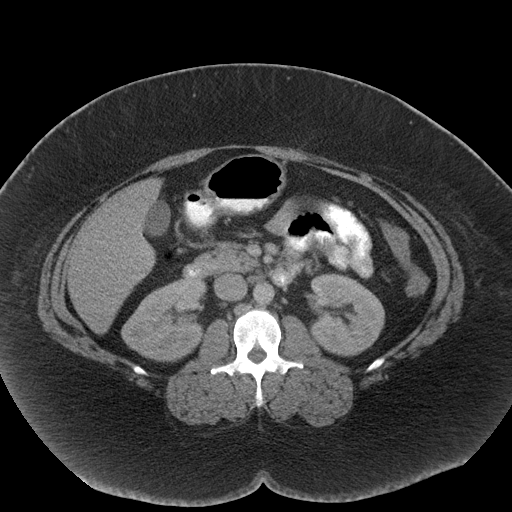
[im 71/100  soft-tissue]
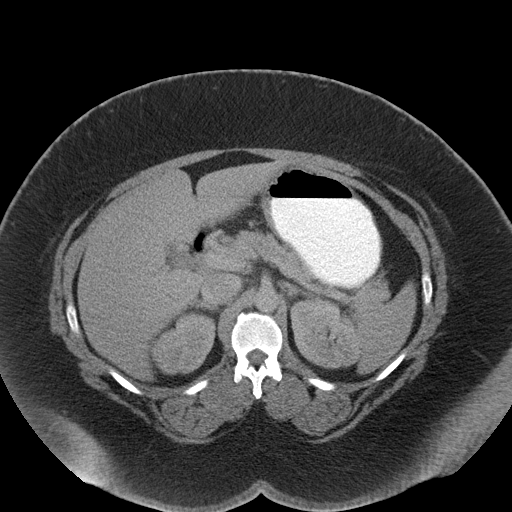
[im 71/100  bone]
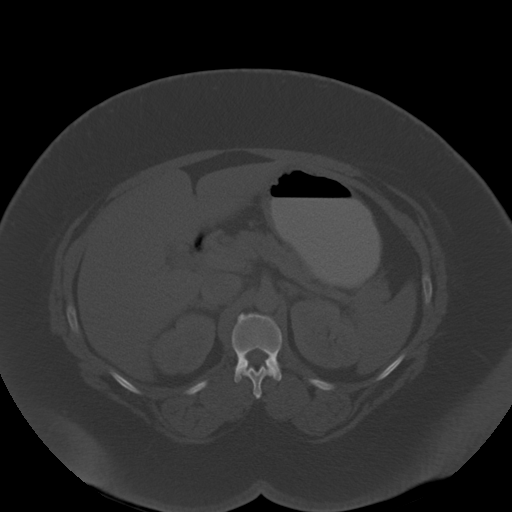
[im 79/100  soft-tissue]
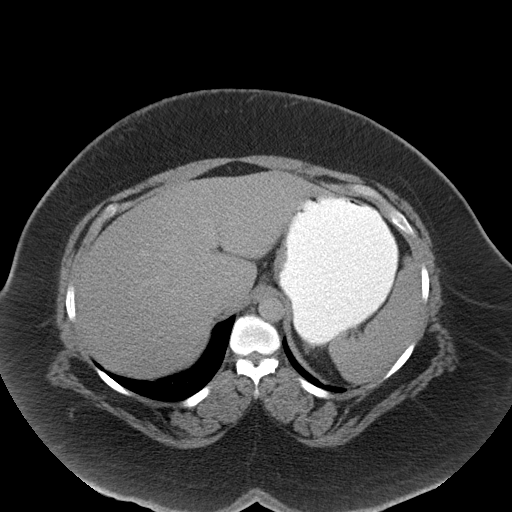
[im 83/100  lung]
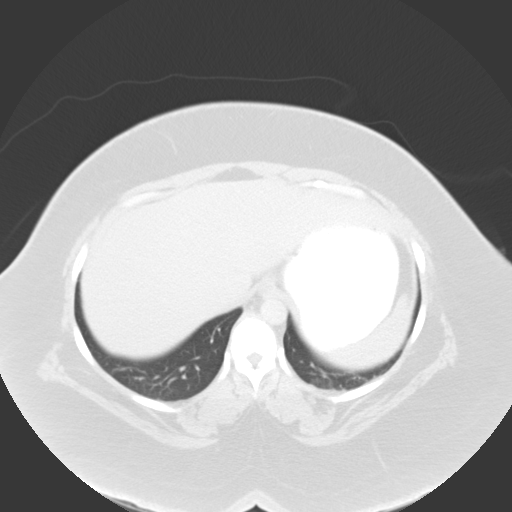
[im 87/100  soft-tissue]
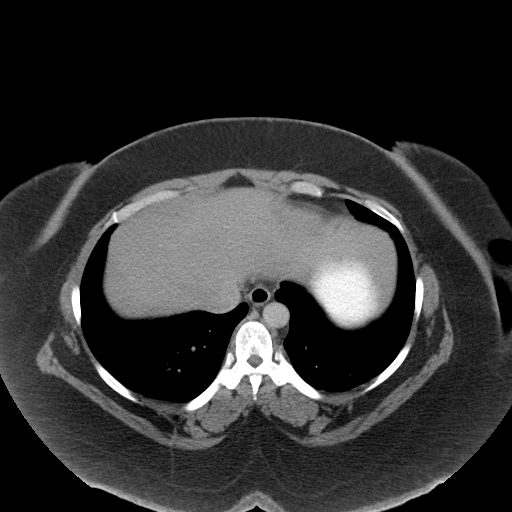
[im 87/100  lung]
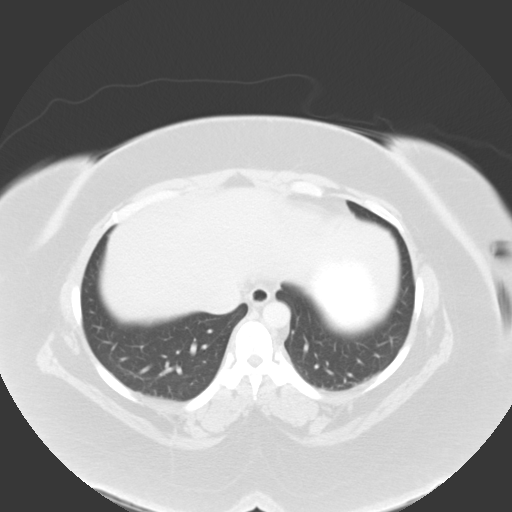
[im 91/100  lung]
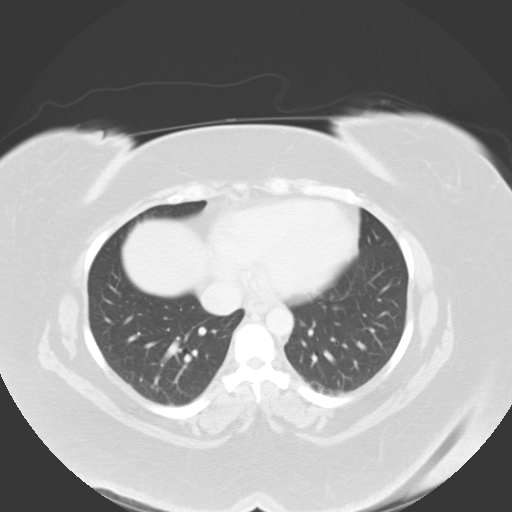
[im 95/100  soft-tissue]
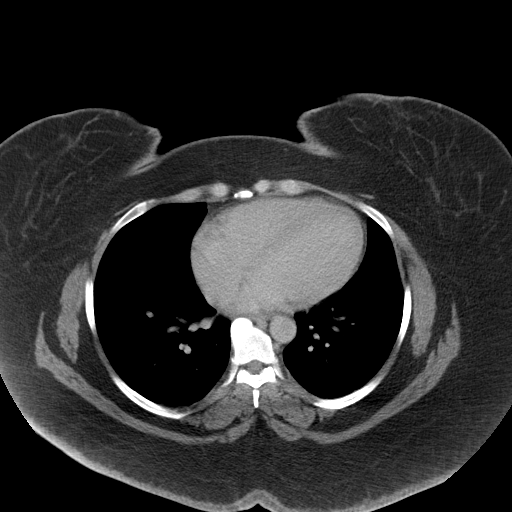
[im 95/100  lung]
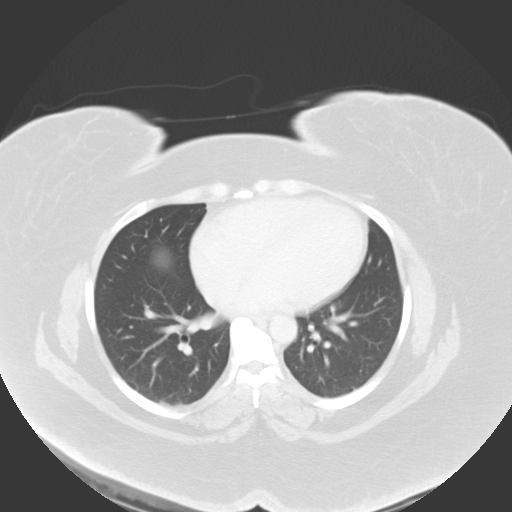

[14 of 32 positions shown; findings below may reference images not displayed]

FINDINGS: Normal hepatic contour.  There is a minimal amount of focal fatty
infiltration adjacent to the fissure for the ligamentum teres.
Normal appearance of the gallbladder.  No intra or extrahepatic
biliary duct dilatation.  No ascites.

There is symmetric enhancement of the kidneys.  There is mild
persistent fetal lobulation of the bilateral kidneys.  No definite
renal stones on this postcontrast examination.  No discrete renal
lesions.  No evidence of urinary obstruction or perinephric
stranding.  Normal appearance of the bilateral adrenal glands,
pancreas and spleen.  Incidental note is made of a small splenule.

Scattered colonic diverticulosis without evidence of
diverticulitis.  There is a tiny linear appendicolith within the
tip of otherwise normal appearing appendix (coronal image 62,
series five).  No associated periappendiceal stranding.  No
pneumoperitoneum, pneumatosis or portal venous gas.

Normal caliber of the abdominal aorta.  The major branch vessels of
the abdominal aorta appear patent on this non CT examination.  No
definite retroperitoneal, mesenteric, pelvic or inguinal
lymphadenopathy.

Normal appearance of the pelvic organs for age.  Incidental note is
made of an approximately 1.9 x 2.0 cm hypoattenuating right-sided
adnexal cyst (image 80, series 2).  No free fluid in the pelvis.

Limited visualization of the lower thorax demonstrates minimal
bibasilar subpleural atelectasis.  No focal airspace opacities.
Normal heart size.  No pericardial effusion.

No acute or aggressive osseous abnormalities.  Mild DDD of L5 - S1
with disc space height loss and posterior disc osteophyte complex
at this level.
IMPRESSION: 1.  No explanation for patient's abdominal pain, nausea and
vomiting.  Specifically, no evidence of enteric urinary
obstruction.
2.  A linear appendicolith is seen within the tip of an otherwise
normal appearing appendix - nonspecific though in the absence of
secondary findings is of doubtful clinical concern.

3.  Colonic diverticulosis without evidence of diverticulitis.

## 2014-06-09 ENCOUNTER — Encounter: Payer: Self-pay | Admitting: Internal Medicine

## 2016-08-28 ENCOUNTER — Ambulatory Visit (INDEPENDENT_AMBULATORY_CARE_PROVIDER_SITE_OTHER): Payer: BLUE CROSS/BLUE SHIELD | Admitting: Emergency Medicine

## 2016-08-28 VITALS — BP 132/86 | HR 68 | Temp 98.5°F | Resp 16 | Ht 67.0 in | Wt 317.0 lb

## 2016-08-28 DIAGNOSIS — J069 Acute upper respiratory infection, unspecified: Secondary | ICD-10-CM | POA: Diagnosis not present

## 2016-08-28 DIAGNOSIS — J0100 Acute maxillary sinusitis, unspecified: Secondary | ICD-10-CM | POA: Diagnosis not present

## 2016-08-28 MED ORDER — DOXYLAMINE-PHENYLEPHRINE 7.5-10 MG PO TABS: 1.0000 | ORAL_TABLET | Freq: Three times a day (TID) | ORAL | 1 refills | Status: AC | PRN

## 2016-08-28 MED ORDER — TRIAMCINOLONE ACETONIDE 55 MCG/ACT NA AERO: 2.0000 | INHALATION_SPRAY | Freq: Every day | NASAL | 12 refills | Status: AC

## 2016-08-28 MED ORDER — CEFUROXIME AXETIL 500 MG PO TABS: 500.0000 mg | ORAL_TABLET | Freq: Two times a day (BID) | ORAL | 0 refills | Status: AC

## 2016-08-28 NOTE — Progress Notes (Signed)
Michele Cole 46 y.o.   Chief Complaint  Patient presents with  . Dizziness  . Sinusitis  . Nasal Congestion  . Ear Pain    HISTORY OF PRESENT ILLNESS: This is a 46 y.o. female complaining of flu-like symptoms since Christmas' Eve not getting better; c/o sinus pressure, runny nose, coryza, cough,dizziness, earche. Symptoms getting worse.  HPI   Prior to Admission medications   Not on File    No Known Allergies  Patient Active Problem List   Diagnosis Date Noted  . CARDIOMYOPATHY, HYPERTENSIVE 11/14/2008  . CARDIOMYOPATHY 09/28/2008  . ANEMIA-IRON DEFICIENCY 09/19/2008  . DEPRESSION 09/19/2008  . COMMON MIGRAINE 09/19/2008  . HYPERTENSION 09/19/2008  . HYPERSOMNIA 09/19/2008  . DYSPNEA/SHORTNESS OF BREATH 09/19/2008  . CHEST PAIN 09/19/2008    Past Medical History:  Diagnosis Date  . Hypertension     Past Surgical History:  Procedure Laterality Date  . CESAREAN SECTION      Social History   Social History  . Marital status: Divorced    Spouse name: N/A  . Number of children: N/A  . Years of education: N/A   Occupational History  . Not on file.   Social History Main Topics  . Smoking status: Never Smoker  . Smokeless tobacco: Not on file  . Alcohol use No  . Drug use: Unknown  . Sexual activity: Not on file   Other Topics Concern  . Not on file   Social History Narrative  . No narrative on file    No family history on file.   Review of Systems  Constitutional: Positive for chills and fever.  HENT: Positive for congestion, ear pain and sinus pain. Negative for nosebleeds and sore throat.   Eyes: Positive for redness (right eye, no discharge).  Respiratory: Positive for cough. Negative for sputum production, shortness of breath and wheezing.   Cardiovascular: Negative.   Gastrointestinal: Negative for abdominal pain, diarrhea, nausea and vomiting.  Genitourinary: Negative.   Musculoskeletal: Negative.   Skin: Negative.   Neurological:  Positive for dizziness and weakness. Negative for focal weakness and headaches.  Endo/Heme/Allergies: Negative.   Psychiatric/Behavioral: Negative.   All other systems reviewed and are negative.  Vitals:   08/28/16 1635  BP: 132/86  Pulse: 68  Resp: 16  Temp: 98.5 F (36.9 C)     Physical Exam  Constitutional: She is oriented to person, place, and time. She appears well-developed and well-nourished.  HENT:  Head: Normocephalic and atraumatic.  Eyes: EOM are normal. Pupils are equal, round, and reactive to light.  Right conjunctiva slightly red; no discharge  Neck: Normal range of motion. Neck supple.  Cardiovascular: Normal rate, regular rhythm, normal heart sounds and intact distal pulses.   Pulmonary/Chest: Effort normal and breath sounds normal.  Abdominal: Soft. Bowel sounds are normal. There is no tenderness.  Musculoskeletal: Normal range of motion.  Neurological: She is alert and oriented to person, place, and time. No sensory deficit. She exhibits normal muscle tone. Coordination normal.  Skin: Skin is warm and dry. Capillary refill takes less than 2 seconds.  Psychiatric: She has a normal mood and affect. Her behavior is normal.  Vitals reviewed.    ASSESSMENT & PLAN: Michele Cole was seen today for dizziness, sinusitis, nasal congestion and ear pain.  Diagnoses and all orders for this visit:  Acute non-recurrent maxillary sinusitis -     Care order/instruction:  Acute upper respiratory infection  Other orders -     cefUROXime (CEFTIN) 500 MG tablet;  Take 1 tablet (500 mg total) by mouth 2 (two) times daily with a meal. -     triamcinolone (NASACORT AQ) 55 MCG/ACT AERO nasal inhaler; Place 2 sprays into the nose daily. -     Doxylamine-Phenylephrine 7.5-10 MG TABS; Take 1 tablet by mouth 3 (three) times daily as needed.    Patient Instructions       IF you received an x-ray today, you will receive an invoice from [facility]. Please contact  [facility] at [phone] with questions or concerns regarding your invoice.   IF you received labwork today, you will receive an invoice from [place]. Please contact [facility] at [phone] with questions or concerns regarding your invoice.   Our billing staff will not be able to assist you with questions regarding bills from these companies.  You will be contacted with the lab results as soon as they are available. The fastest way to get your results is to activate your My Chart account. Instructions are located on the last page of this paperwork. If you have not heard from [place] regarding the results in 2 weeks, please contact this office.     We recommend that you schedule a mammogram for breast cancer screening. Typically, you do not need a referral to do this. Please contact a local imaging center to schedule your mammogram.  [facility] - [phone]  *ask for the Radiology Department [facility] ([place]) - [phone] or [phone]  [facility] - [phone] [place] [phone] [facility] - [phone]  *ask for the [facility] - [phone]  *ask for the Radiology Department [facility] - [phone]  *ask for the [place] - [phone]  Sinusitis, Adult Sinusitis is soreness and inflammation of your sinuses. Sinuses are hollow spaces in the bones around your face. They are located:  Around your eyes.  In the middle of your forehead.  Behind your nose.  In your cheekbones. Your sinuses and nasal passages are lined with a stringy fluid (mucus). Mucus normally drains out of your sinuses. When your nasal tissues get inflamed or swollen, the mucus can get trapped or blocked so air cannot flow through your sinuses. This lets bacteria, viruses, and funguses grow, and that leads to infection. Follow these  instructions at home: Medicines  Take, use, or apply over-the-counter and prescription medicines only as told by your doctor. These may include nasal sprays.  If you were prescribed an antibiotic medicine, take it as told by your doctor. Do not stop taking the antibiotic even if you start to feel better. Hydrate and Humidify  Drink enough water to keep your pee (urine) clear or pale yellow.  Use a cool mist humidifier to keep the humidity level in your home above 50%.  Breathe in steam for 10-15 minutes, 3-4 times a day or as told by your doctor. You can do this in the bathroom while a hot shower is running.  Try not to spend time in cool or dry air. Rest  Rest as much as possible.  Sleep with your head raised (elevated).  Make sure to get enough sleep each night. General instructions  Put a warm, moist washcloth on your face 3-4 times a day or as told by your doctor. This will help with discomfort.  Wash your hands often with soap and water. If there is no soap and water, use hand sanitizer.  Do not smoke. Avoid being around people who are smoking (secondhand smoke).  Keep all follow-up visits as told by your doctor. This is important. Contact a doctor if:  You have a fever.  Your symptoms get worse.  Your symptoms do not get better within 10 days. Get help right away if:  You have a very bad headache.  You cannot stop throwing up (vomiting).  You have pain or swelling around your face or eyes.  You have trouble seeing.  You feel confused.  Your neck is stiff.  You have trouble breathing. This information is not intended to replace advice given to you by your health care provider. Make sure you discuss any questions you have with your health care provider. Document Released: 01/29/2008 Document Revised: 04/07/2016 Document Reviewed: 06/07/2015 Elsevier Interactive Patient Education  2017 Elsevier Inc.      Agustina Caroli, MD [facility]

## 2016-08-28 NOTE — Patient Instructions (Addendum)
IF you received an x-ray today, you will receive an invoice from [facility]. Please contact [facility] at [phone] with questions or concerns regarding your invoice.   IF you received labwork today, you will receive an invoice from [place]. Please contact [facility] at [phone] with questions or concerns regarding your invoice.   Our billing staff will not be able to assist you with questions regarding bills from these companies.  You will be contacted with the lab results as soon as they are available. The fastest way to get your results is to activate your My Chart account. Instructions are located on the last page of this paperwork. If you have not heard from [place] regarding the results in 2 weeks, please contact this office.     We recommend that you schedule a mammogram for breast cancer screening. Typically, you do not need a referral to do this. Please contact a local imaging center to schedule your mammogram.  [facility] - [phone]  *ask for the Radiology Department [facility] ([place]) - [phone] or [phone]  [facility] - [phone] [place] [phone] [facility] - [phone]  *ask for the [facility] - [phone]  *ask for the Radiology Department [facility] - [phone]  *ask for the [place] - [phone]  Sinusitis, Adult Sinusitis is soreness and inflammation of your sinuses. Sinuses are hollow spaces in the bones around your face. They are located:  Around your eyes.  In the middle of your forehead.  Behind your nose.  In your cheekbones. Your sinuses and nasal passages are lined with a stringy fluid (mucus). Mucus normally drains out of your sinuses. When your nasal tissues get inflamed or swollen, the mucus can get trapped or blocked so air cannot flow  through your sinuses. This lets bacteria, viruses, and funguses grow, and that leads to infection. Follow these instructions at home: Medicines  Take, use, or apply over-the-counter and prescription medicines only as told by your doctor. These may include nasal sprays.  If you were prescribed an antibiotic medicine, take it as told by your doctor. Do not stop taking the antibiotic even if you start to feel better. Hydrate and Humidify  Drink enough water to keep your pee (urine) clear or pale yellow.  Use a cool mist humidifier to keep the humidity level in your home above 50%.  Breathe in steam for 10-15 minutes, 3-4 times a day or as told by your doctor. You can do this in the bathroom while a hot shower is running.  Try not to spend time in cool or dry air. Rest  Rest as much as possible.  Sleep with your head raised (elevated).  Make sure to get enough sleep each night. General instructions  Put a warm, moist washcloth on your face 3-4 times a day or as told by your doctor. This will help with discomfort.  Wash your hands often with soap and water. If there is no soap and water, use hand sanitizer.  Do not smoke. Avoid being around people who are smoking (secondhand smoke).  Keep all follow-up visits as told by your doctor. This is important. Contact a doctor if:  You have a fever.  Your symptoms get worse.  Your symptoms do not get better within 10 days. Get help right away if:  You have a very bad headache.  You cannot  stop throwing up (vomiting).  You have pain or swelling around your face or eyes.  You have trouble seeing.  You feel confused.  Your neck is stiff.  You have trouble breathing. This information is not intended to replace advice given to you by your health care provider. Make sure you discuss any questions you have with your health care provider. Document Released: 01/29/2008 Document Revised: 04/07/2016 Document Reviewed:  06/07/2015 Elsevier Interactive Patient Education  2017 [employer].

## 2017-02-04 ENCOUNTER — Emergency Department (HOSPITAL_COMMUNITY)
Admission: EM | Admit: 2017-02-04 | Discharge: 2017-02-05 | Disposition: A | Discharge status: Home / Self Care | Payer: BLUE CROSS/BLUE SHIELD | Attending: Emergency Medicine | Admitting: Emergency Medicine

## 2017-02-04 ENCOUNTER — Encounter (HOSPITAL_COMMUNITY): Payer: Self-pay | Admitting: Emergency Medicine

## 2017-02-04 ENCOUNTER — Emergency Department (HOSPITAL_COMMUNITY): Payer: BLUE CROSS/BLUE SHIELD

## 2017-02-04 DIAGNOSIS — M5432 Sciatica, left side: Secondary | ICD-10-CM | POA: Insufficient documentation

## 2017-02-04 DIAGNOSIS — M545 Low back pain: Secondary | ICD-10-CM | POA: Diagnosis present

## 2017-02-04 DIAGNOSIS — I10 Essential (primary) hypertension: Secondary | ICD-10-CM | POA: Diagnosis not present

## 2017-02-04 DIAGNOSIS — Z79899 Other long term (current) drug therapy: Secondary | ICD-10-CM | POA: Diagnosis not present

## 2017-02-04 LAB — BASIC METABOLIC PANEL
ANION GAP: 6 (ref 5–15)
BUN: 13 mg/dL (ref 6–20)
CALCIUM: 9 mg/dL (ref 8.9–10.3)
CO2: 28 mmol/L (ref 22–32)
Chloride: 106 mmol/L (ref 101–111)
Creatinine, Ser: 0.56 mg/dL (ref 0.44–1.00)
GFR calc Af Amer: >60 mL/min (ref >60)
GLUCOSE: 111 mg/dL — AB (ref 65–99)
POTASSIUM: 4 mmol/L (ref 3.5–5.1)
SODIUM: 140 mmol/L (ref 135–145)

## 2017-02-04 LAB — CBC
HEMATOCRIT: 39.3 % (ref 36.0–46.0)
HEMOGLOBIN: 13.1 g/dL (ref 12.0–15.0)
MCH: 27.5 pg (ref 26.0–34.0)
MCHC: 33.3 g/dL (ref 30.0–36.0)
MCV: 82.6 fL (ref 78.0–100.0)
Platelets: 330 10*3/uL (ref 150–400)
RBC: 4.76 MIL/uL (ref 3.87–5.11)
RDW: 13.9 % (ref 11.5–15.5)
WBC: 5.6 10*3/uL (ref 4.0–10.5)

## 2017-02-04 LAB — POCT I-STAT TROPONIN I: TROPONIN I, POC: 0.01 ng/mL (ref 0.00–0.08)

## 2017-02-04 MED ORDER — PREDNISONE 20 MG PO TABS
60.0000 mg | ORAL_TABLET | Freq: Once | ORAL | Status: AC
  Administered 2017-02-04: 60 mg via ORAL
  Filled 2017-02-04: qty 3

## 2017-02-04 MED ORDER — DIAZEPAM 5 MG/ML IJ SOLN
5.0000 mg | Freq: Once | INTRAMUSCULAR | Status: AC
  Administered 2017-02-04: 5 mg via INTRAVENOUS
  Filled 2017-02-04: qty 2

## 2017-02-04 MED ORDER — CYCLOBENZAPRINE HCL 10 MG PO TABS: 10.0000 mg | ORAL_TABLET | Freq: Two times a day (BID) | ORAL | 0 refills | Status: AC | PRN

## 2017-02-04 MED ORDER — KETOROLAC TROMETHAMINE 30 MG/ML IJ SOLN
15.0000 mg | Freq: Once | INTRAMUSCULAR | Status: AC
  Administered 2017-02-04: 15 mg via INTRAVENOUS
  Filled 2017-02-04: qty 1

## 2017-02-04 MED ORDER — PREDNISONE 20 MG PO TABS: 40.0000 mg | ORAL_TABLET | Freq: Every day | ORAL | 0 refills | Status: AC

## 2017-02-04 NOTE — ED Triage Notes (Signed)
Patient c/o left lower back pain radiating down left leg with tingling in the left foot x2 days. Patient also reports central chest heaviness with SOB x1 day. Hx sciatica.

## 2017-02-04 NOTE — Discharge Instructions (Signed)
You may begin taking the medication tomorrow.  You were given medication in the ER tonight.  Please follow-up with your doctor.  You may need to see a physical therapist or a back specialist if your symptoms do not start to improve in the next few days.  Return to the ER for fever, incontinence, or paralysis.

## 2017-02-04 NOTE — ED Provider Notes (Signed)
[facility] DEPT Provider Note   CSN: [id number] Arrival date & time: 02/04/17  2000     History   Chief Complaint Chief Complaint  Patient presents with  . Chest Pain  . Back Pain    HPI Michele Cole is a 46 y.o. female.  Patient presents to the ED with a chief complaint of left leg pain.  She states that the pain starts in her left buttock and radiates to her left leg. She reports numbness sensation and pain in the leg. She reports a history of the same on the right side and was diagnosed with sciatica.  She states that this feels the same.  She works at a [employer] daily.  She states that she was told she may need surgery in the future for her sciatica.  She has tried taking her friend's gabapentin with no relief.  She states that the symptoms have been gradually worsening.  She denies any fevers, chills, bowel or bladder incontinence, weakness, hx of CA, or IV drug use.  Regarding the mention of CP in triage, patient states that she was having such bad pain that she felt anxious and had some palpitations, but these symptoms have resolved.  She denies any CP, SOB, diaphoresis, or radiating symptoms.   The history is provided by the patient. No language interpreter was used.    Past Medical History:  Diagnosis Date  . Hypertension     Patient Active Problem List   Diagnosis Date Noted  . CARDIOMYOPATHY, HYPERTENSIVE 11/14/2008  . CARDIOMYOPATHY 09/28/2008  . ANEMIA-IRON DEFICIENCY 09/19/2008  . DEPRESSION 09/19/2008  . COMMON MIGRAINE 09/19/2008  . HYPERTENSION 09/19/2008  . HYPERSOMNIA 09/19/2008  . DYSPNEA/SHORTNESS OF BREATH 09/19/2008  . CHEST PAIN 09/19/2008    Past Surgical History:  Procedure Laterality Date  . CESAREAN SECTION      OB History    No data available       Home Medications    Prior to Admission medications   Medication Sig Start Date End Date Taking? Authorizing Provider  acetaminophen (TYLENOL) 500 MG tablet Take 500 mg by mouth  every 6 (six) hours as needed for moderate pain.   Yes [provider]  Calcium Carbonate-Vitamin D (CALTRATE 600+D) 600-400 MG-UNIT tablet Take 1 tablet by mouth daily.   Yes [provider]  ibuprofen (ADVIL,MOTRIN) 200 MG tablet Take 200 mg by mouth every 6 (six) hours as needed for moderate pain.   Yes [provider]  Lidocaine 0.5 % AERO Apply 1 patch topically daily as needed (pain).   Yes [provider]  Multiple Vitamins-Minerals (MULTIVITAMIN ADULT) TABS Take 1 tablet by mouth daily.   Yes [provider]  triamcinolone (NASACORT AQ) 55 MCG/ACT AERO nasal inhaler Place 2 sprays into the nose daily. Patient not taking: Reported on 02/04/2017 08/28/16   Horald Pollen, MD    Family History No family history on file.  Social History Social History  Substance Use Topics  . Smoking status: Never Smoker  . Smokeless tobacco: Not on file  . Alcohol use No     Allergies   Patient has no known allergies.   Review of Systems Review of Systems  All other systems reviewed and are negative.    Physical Exam Updated Vital Signs BP (!) 162/98 (BP Location: Right Wrist)   Pulse 81   Temp 98.2 F (36.8 C) (Oral)   Resp 19   LMP 02/01/2017   SpO2 98%   Physical Exam Physical  Exam  Constitutional: Pt appears well-developed and well-nourished. No distress.  HENT:  Head: Normocephalic and atraumatic.  Mouth/Throat: Oropharynx is clear and moist. No oropharyngeal exudate.  Eyes: Conjunctivae are normal.  Neck: Normal range of motion. Neck supple.  No meningismus Cardiovascular: Normal rate, regular rhythm and intact distal pulses.   Pulmonary/Chest: Effort normal and breath sounds normal. No respiratory distress. Pt has no wheezes.  Abdominal: Pt exhibits no distension Musculoskeletal:  Left lumbar and left buttock tender to palpation, no bony CTLS spine tenderness, deformity, step-off, or crepitus Lymphadenopathy: Pt has no  cervical adenopathy.  Neurological: Pt is alert and oriented Speech is clear and goal oriented, follows commands Normal 5/5 strength in upper and lower extremities bilaterally including dorsiflexion and plantar flexion, strong and equal grip strength Sensation intact Great toe extension intact Moves extremities without ataxia, coordination intact Normal gait Normal balance No Clonus Skin: Skin is warm and dry. No rash noted. Pt is not diaphoretic. No erythema.  Psychiatric: Pt has a normal mood and affect. Behavior is normal.  Nursing note and vitals reviewed.   ED Treatments / Results  Labs (all labs ordered are listed, but only abnormal results are displayed) Labs Reviewed  BASIC METABOLIC PANEL - Abnormal; Notable for the following:       Result Value   Glucose, Bld 111 (*)    All other components within normal limits  CBC  I-STAT TROPOININ, ED  POCT I-STAT TROPONIN I    EKG  EKG Interpretation None       Radiology Dg Chest 2 View  Result Date: 02/04/2017 CLINICAL DATA:  46 year old with acute onset of shortness of breath approximately 3 hours prior to emergency department admission. She also has acute low back pain with left lower extremity sciatica which began 2 days ago. EXAM: CHEST  2 VIEW COMPARISON:  09/19/2008, 10/22/2006, 08/03/2003. FINDINGS: Cardiomediastinal silhouette unremarkable, unchanged. Lungs clear. Bronchovascular markings normal. Pulmonary vascularity normal. No visible pleural effusions. No pneumothorax. Degenerative changes involving the thoracic spine. IMPRESSION: No acute cardiopulmonary disease. Electronically Signed   By: Evangeline Dakin M.D.   On: 02/04/2017 20:52    Procedures Procedures (including critical care time)  Medications Ordered in ED Medications  ketorolac (TORADOL) 30 MG/ML injection 15 mg (not administered)  diazepam (VALIUM) injection 5 mg (not administered)     Initial Impression / Assessment and Plan / ED Course  I  have reviewed the triage vital signs and the nursing notes.  Pertinent labs & imaging results that were available during my care of the patient were reviewed by me and considered in my medical decision making (see chart for details).     I have a low suspicion of ACS or PE.  CP mentioned in triage was actually a palpitations type sensation, which the patient attributes to being in severe pain.  This has completely resolved.  Her labs, CXR, and EKG are reassuring.  Patient with back pain. No neurological deficits and normal neuro exam.  Patient is ambulatory.  No loss of bowel or bladder control.  Doubt cauda equina.  Denies fever,  doubt epidural abscess or other lesion. Recommend back exercises, stretching, RICE, and will treat with a short course of prednisone and flexeril.  Encouraged the patient that there could be a need for additional workup and/or imaging such as MRI, if the symptoms do not resolve. Patient advised that if the back pain does not resolve, or radiates, this could progress to more serious conditions and is encouraged to follow-up  with PCP or orthopedics within 2 weeks.     Final Clinical Impressions(s) / ED Diagnoses   Final diagnoses:  Sciatica of left side    New Prescriptions New Prescriptions   CYCLOBENZAPRINE (FLEXERIL) 10 MG TABLET    Take 1 tablet (10 mg total) by mouth 2 (two) times daily as needed for muscle spasms.   PREDNISONE (DELTASONE) 20 MG TABLET    Take 2 tablets (40 mg total) by mouth daily.     Montine Circle, PA-C 02/04/17 2307    Sherwood Gambler, MD 02/05/17 [phone]

## 2017-03-31 NOTE — H&P (Signed)
Patient name Armida, Vickroy JHHIDUPBD#[id number] CSN# [id number]  Darlyn Chamber, MD 03/31/2017 1:28 PM

## 2017-03-31 NOTE — H&P (Signed)
NAMEMarland Kitchen  Michele Cole, [place]ARLAND NO.:  [id number]  MEDICAL RECORD NO.:  [id number]  LOCATION:                                 FACILITY:  PHYSICIAN:  Darlyn Chamber, M.D.        DATE OF BIRTH:  DATE OF ADMISSION: DATE OF DISCHARGE:                             HISTORY & PHYSICAL   Date of her surgery is April 16, 2017, at [facility] outpatient area.  The patient is a 46 year old, gravida 3, para 2 female presents for laparoscopic-assisted vaginal hysterectomy and removal of both fallopian tubes.  The patient had a previous NovaSure ablation. Approximately a year ago, her cycles started coming back with increasing pain and discomfort.  She has tried over-the-counter agents with no help.  She has a history of breast cancer at age 44.  She has had 2 prior cesarean sections as well as laparoscopy.  After discussion of options, she now presents for definitive surgery.  ALLERGIES:  No known drug allergies.  MEDICATIONS:  Prednisone 2% and cyclobenzaprine for itching.  PAST MEDICAL HISTORY:  She has a history of hypertension, arthritis, urinary tract infections, anemia, migraine headaches.  SURGERY:  She has had 2 cesarean sections, laparoscopy, gastric bypass surgery.  SOCIAL HISTORY:  No tobacco or alcohol use.  FAMILY HISTORY:  Mother with a history of breast cancer.  Has also a family history of colon cancer.  History of hypertension, diabetes, kidney disease.  REVIEW OF SYSTEMS:  Noncontributory.  PHYSICAL EXAMINATION:  VITAL SIGNS:  The patient is afebrile.  Stable vital signs. HEENT:  The patient normocephalic.  Pupils equal, round, and reactive to light and accommodation.  Extraocular movements are intact.  Sclerae and conjunctivae are clear.  Oropharynx clear. NECK:  Without thyromegaly. LUNGS:  Clear. CARDIOVASCULAR:  Regular rhythm and rate.  There are no murmurs or gallops. ABDOMEN:  Benign.  Well-healed low-transverse incision. PELVIC:   Normal external genitalia.  Vaginal mucosa is clear.  Cervix unremarkable.  Uterus approximately 8-10 weeks in size.  Adnexa unremarkable. EXTREMITIES:  Trace edema. NEUROLOGIC:  Gross normal limits.  IMPRESSION:  Symptomatic uterine fibroids.  PLAN:  The patient to undergo laparoscopic-assisted vaginal hysterectomy, removal of both fallopian tubes.  Risks of surgery have been discussed including the risk of infection.  The risk of hemorrhage that could require transfusion with the risk of AIDS or hepatitis.  Risk of injury to adjacent organs including bladder, bowel, ureters that could require further exploratory surgery.  Risk of deep venous thrombosis and pulmonary embolus.  The patient expressed understanding of indications and risks and alternatives.     Darlyn Chamber, M.D.     JSM/MEDQ  D:  03/31/2017  T:  03/31/2017  Job:  [id number]

## 2017-04-04 NOTE — Progress Notes (Signed)
02-04-17 (EPIC) EKG, CXR

## 2017-04-04 NOTE — Patient Instructions (Addendum)
Michele Cole  04/04/2017      Your procedure is scheduled on 04-16-17  Report to [facility]  At 6:00 A.M.  Call this number if you have problems the morning of surgery:[phone]             OUR ADDRESS IS [address] , WE ARE LOCATED IN [place].    Remember:  Do not eat food or drink liquids after midnight.  Take these medicines the morning of surgery with A SIP OF WATER: None  Do not wear jewelry, make-up or nail polish.  Do not wear lotions, powders, or perfumes, or deoderant.  Do not shave 48 hours prior to surgery.  Men may shave face and neck.  Do not bring valuables to the hospital.  [facility] is not responsible for any belongings or valuables.  Contacts, dentures or bridgework may not be worn into surgery.  Leave your suitcase in the car.  After surgery it may be brought to your room.  For patients admitted to the hospital, discharge time will be determined by your treatment team.   Special instructions:   Please read over the following fact sheets that you were given.  [facility] - Preparing for Surgery Before surgery, you can play an important role.  Because skin is not sterile, your skin needs to be as free of germs as possible.  You can reduce the number of germs on your skin by washing with CHG (chlorahexidine gluconate) soap before surgery.  CHG is an antiseptic cleaner which kills germs and bonds with the skin to continue killing germs even after washing. Please DO NOT use if you have an allergy to CHG or antibacterial soaps.  If your skin becomes reddened/irritated stop using the CHG and inform your nurse when you arrive at Short Stay. Do not shave (including legs and underarms) for at least 48 hours prior to the first CHG shower.  You may shave your face/neck. Please follow these instructions carefully:  1.  Shower with CHG Soap the night before surgery and the  morning of Surgery.  2.  If you choose  to wash your hair, wash your hair first as usual with your  normal  shampoo.  3.  After you shampoo, rinse your hair and body thoroughly to remove the  shampoo.                           4.  Use CHG as you would any other liquid soap.  You can apply chg directly  to the skin and wash                       Gently with a scrungie or clean washcloth.  5.  Apply the CHG Soap to your body ONLY FROM THE NECK DOWN.   Do not use on face/ open                           Wound or open sores. Avoid contact with eyes, ears mouth and genitals (private parts).                       Wash face,  Genitals (private parts) with your normal soap.  6.  Wash thoroughly, paying special attention to the area where your surgery  will be performed.  7.  Thoroughly rinse your body with warm water from the neck down.  8.  DO NOT shower/wash with your normal soap after using and rinsing off  the CHG Soap.                9.  Pat yourself dry with a clean towel.            10.  Wear clean pajamas.            11.  Place clean sheets on your bed the night of your first shower and do not  sleep with pets. Day of Surgery : Do not apply any lotions/deodorants the morning of surgery.  Please wear clean clothes to the hospital/surgery center.  FAILURE TO FOLLOW THESE INSTRUCTIONS MAY RESULT IN THE CANCELLATION OF YOUR SURGERY PATIENT SIGNATURE_________________________________  NURSE SIGNATURE__________________________________  ________________________________________________________________________    WHAT IS A BLOOD TRANSFUSION? Blood Transfusion Information  A transfusion is the replacement of blood or some of its parts. Blood is made up of multiple cells which provide different functions.  Red blood cells carry oxygen and are used for blood loss replacement.  White blood cells fight against infection.  Platelets control bleeding.  Plasma helps clot blood.  Other blood products are available for specialized  needs, such as hemophilia or other clotting disorders. BEFORE THE TRANSFUSION  Who gives blood for transfusions?   Healthy volunteers who are fully evaluated to make sure their blood is safe. This is blood bank blood. Transfusion therapy is the safest it has ever been in the practice of medicine. Before blood is taken from a donor, a complete history is taken to make sure that person has no history of diseases nor engages in risky social behavior (examples are intravenous drug use or sexual activity with multiple partners). The donor's travel history is screened to minimize risk of transmitting infections, such as malaria. The donated blood is tested for signs of infectious diseases, such as HIV and hepatitis. The blood is then tested to be sure it is compatible with you in order to minimize the chance of a transfusion reaction. If you or a relative donates blood, this is often done in anticipation of surgery and is not appropriate for emergency situations. It takes many days to process the donated blood. RISKS AND COMPLICATIONS Although transfusion therapy is very safe and saves many lives, the main dangers of transfusion include:   Getting an infectious disease.  Developing a transfusion reaction. This is an allergic reaction to something in the blood you were given. Every precaution is taken to prevent this. The decision to have a blood transfusion has been considered carefully by your caregiver before blood is given. Blood is not given unless the benefits outweigh the risks. AFTER THE TRANSFUSION  Right after receiving a blood transfusion, you will usually feel much better and more energetic. This is especially true if your red blood cells have gotten low (anemic). The transfusion raises the level of the red blood cells which carry oxygen, and this usually causes an energy increase.  The nurse administering the transfusion will monitor you carefully for complications. HOME CARE INSTRUCTIONS   No special instructions are needed after a transfusion. You may find your energy is better. Speak with your caregiver about any limitations on activity for underlying diseases you may have. SEEK MEDICAL CARE IF:   Your condition is not improving  after your transfusion.  You develop redness or irritation at the intravenous (IV) site. SEEK IMMEDIATE MEDICAL CARE IF:  Any of the following symptoms occur over the next 12 hours:  Shaking chills.  You have a temperature by mouth above 102 F (38.9 C), not controlled by medicine.  Chest, back, or muscle pain.  People around you feel you are not acting correctly or are confused.  Shortness of breath or difficulty breathing.  Dizziness and fainting.  You get a rash or develop hives.  You have a decrease in urine output.  Your urine turns a dark color or changes to pink, red, or brown. Any of the following symptoms occur over the next 10 days:  You have a temperature by mouth above 102 F (38.9 C), not controlled by medicine.  Shortness of breath.  Weakness after normal activity.  The white part of the eye turns yellow (jaundice).  You have a decrease in the amount of urine or are urinating less often.  Your urine turns a dark color or changes to pink, red, or brown. Document Released: 08/09/2000 Document Revised: 11/04/2011 Document Reviewed: 03/28/2008 [facility] Patient Information 2014 [place], [place].  _______________________________________________________________________

## 2017-04-08 ENCOUNTER — Encounter (INDEPENDENT_AMBULATORY_CARE_PROVIDER_SITE_OTHER): Payer: Self-pay

## 2017-04-08 ENCOUNTER — Encounter (HOSPITAL_COMMUNITY)
Admission: RE | Admit: 2017-04-08 | Discharge: 2017-04-08 | Disposition: A | Discharge status: Home / Self Care | Payer: BLUE CROSS/BLUE SHIELD | Source: Ambulatory Visit | Attending: Obstetrics and Gynecology | Admitting: Obstetrics and Gynecology

## 2017-04-08 ENCOUNTER — Encounter (HOSPITAL_COMMUNITY): Payer: Self-pay

## 2017-04-08 DIAGNOSIS — Z01812 Encounter for preprocedural laboratory examination: Secondary | ICD-10-CM | POA: Insufficient documentation

## 2017-04-08 DIAGNOSIS — D259 Leiomyoma of uterus, unspecified: Secondary | ICD-10-CM | POA: Diagnosis not present

## 2017-04-08 LAB — CBC
HCT: 40.3 % (ref 36.0–46.0)
Hemoglobin: 12.9 g/dL (ref 12.0–15.0)
MCH: 26.4 pg (ref 26.0–34.0)
MCHC: 32 g/dL (ref 30.0–36.0)
MCV: 82.6 fL (ref 78.0–100.0)
PLATELETS: 361 10*3/uL (ref 150–400)
RBC: 4.88 MIL/uL (ref 3.87–5.11)
RDW: 14.1 % (ref 11.5–15.5)
WBC: 5.9 10*3/uL (ref 4.0–10.5)

## 2017-04-08 LAB — ABO/RH: ABO/RH(D): B POS

## 2017-04-08 LAB — BASIC METABOLIC PANEL
Anion gap: 5 (ref 5–15)
BUN: 12 mg/dL (ref 6–20)
CHLORIDE: 105 mmol/L (ref 101–111)
CO2: 30 mmol/L (ref 22–32)
CREATININE: 0.73 mg/dL (ref 0.44–1.00)
Calcium: 9.2 mg/dL (ref 8.9–10.3)
GFR calc non Af Amer: >60 mL/min (ref >60)
Glucose, Bld: 74 mg/dL (ref 65–99)
Potassium: 4.4 mmol/L (ref 3.5–5.1)
Sodium: 140 mmol/L (ref 135–145)

## 2017-04-08 LAB — HCG, SERUM, QUALITATIVE: Preg, Serum: NEGATIVE

## 2017-04-16 ENCOUNTER — Observation Stay (HOSPITAL_BASED_OUTPATIENT_CLINIC_OR_DEPARTMENT_OTHER)
Admission: RE | Admit: 2017-04-16 | Discharge: 2017-04-17 | Disposition: A | Discharge status: Home / Self Care | Payer: BLUE CROSS/BLUE SHIELD | Source: Ambulatory Visit | Attending: Obstetrics and Gynecology | Admitting: Obstetrics and Gynecology

## 2017-04-16 ENCOUNTER — Ambulatory Visit (HOSPITAL_BASED_OUTPATIENT_CLINIC_OR_DEPARTMENT_OTHER): Payer: BLUE CROSS/BLUE SHIELD | Admitting: Anesthesiology

## 2017-04-16 ENCOUNTER — Encounter (HOSPITAL_BASED_OUTPATIENT_CLINIC_OR_DEPARTMENT_OTHER): Payer: Self-pay | Admitting: *Deleted

## 2017-04-16 ENCOUNTER — Encounter (HOSPITAL_BASED_OUTPATIENT_CLINIC_OR_DEPARTMENT_OTHER)
Admission: RE | Disposition: A | Discharge status: Home / Self Care | Payer: Self-pay | Source: Ambulatory Visit | Attending: Obstetrics and Gynecology

## 2017-04-16 DIAGNOSIS — G43909 Migraine, unspecified, not intractable, without status migrainosus: Secondary | ICD-10-CM | POA: Diagnosis not present

## 2017-04-16 DIAGNOSIS — N838 Other noninflammatory disorders of ovary, fallopian tube and broad ligament: Secondary | ICD-10-CM | POA: Diagnosis not present

## 2017-04-16 DIAGNOSIS — Z803 Family history of malignant neoplasm of breast: Secondary | ICD-10-CM | POA: Insufficient documentation

## 2017-04-16 DIAGNOSIS — N858 Other specified noninflammatory disorders of uterus: Secondary | ICD-10-CM | POA: Diagnosis not present

## 2017-04-16 DIAGNOSIS — N8311 Corpus luteum cyst of right ovary: Secondary | ICD-10-CM | POA: Diagnosis not present

## 2017-04-16 DIAGNOSIS — M1990 Unspecified osteoarthritis, unspecified site: Secondary | ICD-10-CM | POA: Insufficient documentation

## 2017-04-16 DIAGNOSIS — Z79899 Other long term (current) drug therapy: Secondary | ICD-10-CM | POA: Diagnosis not present

## 2017-04-16 DIAGNOSIS — Z841 Family history of disorders of kidney and ureter: Secondary | ICD-10-CM | POA: Insufficient documentation

## 2017-04-16 DIAGNOSIS — Z90710 Acquired absence of both cervix and uterus: Secondary | ICD-10-CM | POA: Diagnosis present

## 2017-04-16 DIAGNOSIS — Z853 Personal history of malignant neoplasm of breast: Secondary | ICD-10-CM | POA: Diagnosis not present

## 2017-04-16 DIAGNOSIS — Z9884 Bariatric surgery status: Secondary | ICD-10-CM | POA: Diagnosis not present

## 2017-04-16 DIAGNOSIS — Z833 Family history of diabetes mellitus: Secondary | ICD-10-CM | POA: Diagnosis not present

## 2017-04-16 DIAGNOSIS — Z862 Personal history of diseases of the blood and blood-forming organs and certain disorders involving the immune mechanism: Secondary | ICD-10-CM | POA: Diagnosis not present

## 2017-04-16 DIAGNOSIS — D251 Intramural leiomyoma of uterus: Secondary | ICD-10-CM | POA: Insufficient documentation

## 2017-04-16 DIAGNOSIS — Z800 Family history of malignant neoplasm of digestive organs: Secondary | ICD-10-CM | POA: Insufficient documentation

## 2017-04-16 DIAGNOSIS — N939 Abnormal uterine and vaginal bleeding, unspecified: Secondary | ICD-10-CM | POA: Diagnosis present

## 2017-04-16 DIAGNOSIS — D259 Leiomyoma of uterus, unspecified: Secondary | ICD-10-CM | POA: Diagnosis present

## 2017-04-16 HISTORY — PX: LAPAROSCOPIC VAGINAL HYSTERECTOMY WITH SALPINGECTOMY: SHX6680

## 2017-04-16 HISTORY — PX: CYSTOSCOPY: SHX5120

## 2017-04-16 LAB — TYPE AND SCREEN
ABO/RH(D): B POS
Antibody Screen: NEGATIVE

## 2017-04-16 SURGERY — HYSTERECTOMY, VAGINAL, LAPAROSCOPY-ASSISTED, WITH SALPINGECTOMY
Anesthesia: General | Site: Vagina | Laterality: Bilateral

## 2017-04-16 MED ORDER — ONDANSETRON HCL 4 MG/2ML IJ SOLN
4.0000 mg | Freq: Once | INTRAMUSCULAR | Status: AC | PRN
  Administered 2017-04-16: 4 mg via INTRAVENOUS
  Filled 2017-04-16: qty 2

## 2017-04-16 MED ORDER — DIPHENHYDRAMINE HCL 50 MG/ML IJ SOLN
12.5000 mg | Freq: Four times a day (QID) | INTRAMUSCULAR | Status: DC | PRN
  Filled 2017-04-16: qty 0.25

## 2017-04-16 MED ORDER — HYDROMORPHONE HCL 1 MG/ML IJ SOLN
0.2500 mg | INTRAMUSCULAR | Status: DC | PRN
  Administered 2017-04-16 (×3): 0.5 mg via INTRAVENOUS
  Filled 2017-04-16: qty 0.5

## 2017-04-16 MED ORDER — ONDANSETRON HCL 4 MG/2ML IJ SOLN
INTRAMUSCULAR | Status: AC
  Filled 2017-04-16: qty 2

## 2017-04-16 MED ORDER — SUCCINYLCHOLINE CHLORIDE 20 MG/ML IJ SOLN
INTRAMUSCULAR | Status: DC | PRN
  Administered 2017-04-16: 140 mg via INTRAVENOUS

## 2017-04-16 MED ORDER — PROPOFOL 10 MG/ML IV BOLUS
INTRAVENOUS | Status: DC | PRN
  Administered 2017-04-16: 200 mg via INTRAVENOUS

## 2017-04-16 MED ORDER — PROMETHAZINE HCL 6.25 MG/5ML PO SYRP
6.2500 mg | ORAL_SOLUTION | Freq: Once | ORAL | Status: DC
  Filled 2017-04-16: qty 5

## 2017-04-16 MED ORDER — EPHEDRINE 5 MG/ML INJ
INTRAVENOUS | Status: AC
  Filled 2017-04-16: qty 10

## 2017-04-16 MED ORDER — KETOROLAC TROMETHAMINE 30 MG/ML IJ SOLN
INTRAMUSCULAR | Status: AC
  Filled 2017-04-16: qty 1

## 2017-04-16 MED ORDER — DIPHENHYDRAMINE HCL 12.5 MG/5ML PO ELIX
12.5000 mg | ORAL_SOLUTION | Freq: Four times a day (QID) | ORAL | Status: DC | PRN
  Filled 2017-04-16: qty 5

## 2017-04-16 MED ORDER — ROCURONIUM BROMIDE 50 MG/5ML IV SOSY
PREFILLED_SYRINGE | INTRAVENOUS | Status: AC
  Filled 2017-04-16: qty 5

## 2017-04-16 MED ORDER — ONDANSETRON HCL 4 MG/2ML IJ SOLN
4.0000 mg | Freq: Four times a day (QID) | INTRAMUSCULAR | Status: DC | PRN
  Administered 2017-04-16: 4 mg via INTRAVENOUS
  Filled 2017-04-16: qty 2

## 2017-04-16 MED ORDER — LACTATED RINGERS IV SOLN
INTRAVENOUS | Status: DC
  Administered 2017-04-16 – 2017-04-17 (×2): via INTRAVENOUS
  Filled 2017-04-16 (×3): qty 1000

## 2017-04-16 MED ORDER — DEXAMETHASONE SODIUM PHOSPHATE 10 MG/ML IJ SOLN
INTRAMUSCULAR | Status: AC
  Filled 2017-04-16: qty 1

## 2017-04-16 MED ORDER — GLYCOPYRROLATE 0.2 MG/ML IV SOSY
PREFILLED_SYRINGE | INTRAVENOUS | Status: AC
  Filled 2017-04-16: qty 5

## 2017-04-16 MED ORDER — PROPOFOL 10 MG/ML IV BOLUS
INTRAVENOUS | Status: AC
Start: 2017-04-16 — End: ?
  Filled 2017-04-16: qty 40

## 2017-04-16 MED ORDER — LIDOCAINE HCL (CARDIAC) 20 MG/ML IV SOLN
INTRAVENOUS | Status: DC | PRN
  Administered 2017-04-16: 100 mg via INTRAVENOUS

## 2017-04-16 MED ORDER — GLYCOPYRROLATE 0.2 MG/ML IJ SOLN
INTRAMUSCULAR | Status: DC | PRN
  Administered 2017-04-16: 0.1 mg via INTRAVENOUS

## 2017-04-16 MED ORDER — CEFAZOLIN SODIUM-DEXTROSE 2-4 GM/100ML-% IV SOLN
2.0000 g | INTRAVENOUS | Status: DC
  Filled 2017-04-16: qty 100

## 2017-04-16 MED ORDER — ONDANSETRON HCL 4 MG/2ML IJ SOLN
4.0000 mg | Freq: Four times a day (QID) | INTRAMUSCULAR | Status: DC | PRN
  Administered 2017-04-17: 4 mg via INTRAVENOUS
  Filled 2017-04-16: qty 2

## 2017-04-16 MED ORDER — HYDROMORPHONE 1 MG/ML IV SOLN
INTRAVENOUS | Status: DC
  Administered 2017-04-16: 14:00:00 via INTRAVENOUS
  Administered 2017-04-16: 0.2 mg via INTRAVENOUS
  Administered 2017-04-17: 1.39 mg via INTRAVENOUS
  Filled 2017-04-16 (×2): qty 25

## 2017-04-16 MED ORDER — NALOXONE HCL 0.4 MG/ML IJ SOLN
0.4000 mg | INTRAMUSCULAR | Status: DC | PRN
Start: 2017-04-16 — End: 2017-04-17
  Filled 2017-04-16: qty 1

## 2017-04-16 MED ORDER — FENTANYL CITRATE (PF) 250 MCG/5ML IJ SOLN
INTRAMUSCULAR | Status: AC
  Filled 2017-04-16: qty 5

## 2017-04-16 MED ORDER — ONDANSETRON HCL 4 MG PO TABS
4.0000 mg | ORAL_TABLET | Freq: Four times a day (QID) | ORAL | Status: DC | PRN
  Filled 2017-04-16: qty 1

## 2017-04-16 MED ORDER — EPHEDRINE SULFATE 50 MG/ML IJ SOLN
INTRAMUSCULAR | Status: DC | PRN
  Administered 2017-04-16: 10 mg via INTRAVENOUS
  Administered 2017-04-16: 5 mg via INTRAVENOUS
  Administered 2017-04-16: 10 mg via INTRAVENOUS

## 2017-04-16 MED ORDER — MIDAZOLAM HCL 2 MG/2ML IJ SOLN
INTRAMUSCULAR | Status: AC
  Filled 2017-04-16: qty 2

## 2017-04-16 MED ORDER — PROMETHAZINE HCL 25 MG/ML IJ SOLN
INTRAMUSCULAR | Status: AC
  Filled 2017-04-16: qty 1

## 2017-04-16 MED ORDER — MENTHOL 3 MG MT LOZG
1.0000 | LOZENGE | OROMUCOSAL | Status: DC | PRN
  Filled 2017-04-16: qty 9

## 2017-04-16 MED ORDER — MIDAZOLAM HCL 5 MG/5ML IJ SOLN
INTRAMUSCULAR | Status: DC | PRN
  Administered 2017-04-16: 1 mg via INTRAVENOUS

## 2017-04-16 MED ORDER — HYDROMORPHONE HCL-NACL 0.5-0.9 MG/ML-% IV SOSY
PREFILLED_SYRINGE | INTRAVENOUS | Status: AC
  Filled 2017-04-16: qty 1

## 2017-04-16 MED ORDER — DEXTROSE 5 % IV SOLN
3.0000 g | INTRAVENOUS | Status: AC
  Administered 2017-04-16: 3 g via INTRAVENOUS
  Filled 2017-04-16 (×2): qty 3000

## 2017-04-16 MED ORDER — PHENYLEPHRINE 40 MCG/ML (10ML) SYRINGE FOR IV PUSH (FOR BLOOD PRESSURE SUPPORT)
PREFILLED_SYRINGE | INTRAVENOUS | Status: AC
  Filled 2017-04-16: qty 20

## 2017-04-16 MED ORDER — PROMETHAZINE HCL 25 MG/ML IJ SOLN
12.5000 mg | INTRAMUSCULAR | Status: DC | PRN
  Administered 2017-04-16: 12.5 mg via INTRAVENOUS
  Filled 2017-04-16: qty 1

## 2017-04-16 MED ORDER — LACTATED RINGERS IV SOLN
INTRAVENOUS | Status: DC
  Administered 2017-04-16 (×3): via INTRAVENOUS
  Filled 2017-04-16: qty 1000

## 2017-04-16 MED ORDER — ROCURONIUM BROMIDE 100 MG/10ML IV SOLN
INTRAVENOUS | Status: DC | PRN
  Administered 2017-04-16: 20 mg via INTRAVENOUS
  Administered 2017-04-16: 10 mg via INTRAVENOUS
  Administered 2017-04-16 (×2): 5 mg via INTRAVENOUS
  Administered 2017-04-16: 10 mg via INTRAVENOUS
  Administered 2017-04-16: 30 mg via INTRAVENOUS

## 2017-04-16 MED ORDER — HYDROMORPHONE HCL-NACL 0.5-0.9 MG/ML-% IV SOSY
PREFILLED_SYRINGE | INTRAVENOUS | Status: AC
Start: 2017-04-16 — End: ?
  Filled 2017-04-16: qty 1

## 2017-04-16 MED ORDER — DEXAMETHASONE SODIUM PHOSPHATE 4 MG/ML IJ SOLN
INTRAMUSCULAR | Status: DC | PRN
  Administered 2017-04-16: 4 mg via INTRAVENOUS

## 2017-04-16 MED ORDER — FENTANYL CITRATE (PF) 100 MCG/2ML IJ SOLN
INTRAMUSCULAR | Status: DC | PRN
  Administered 2017-04-16 (×3): 25 ug via INTRAVENOUS
  Administered 2017-04-16 (×2): 50 ug via INTRAVENOUS
  Administered 2017-04-16 (×7): 25 ug via INTRAVENOUS

## 2017-04-16 MED ORDER — LACTATED RINGERS IR SOLN
Status: DC | PRN
Start: 2017-04-16 — End: 2017-04-16
  Administered 2017-04-16: 3000 mL

## 2017-04-16 MED ORDER — STERILE WATER FOR IRRIGATION IR SOLN
Status: DC | PRN
  Administered 2017-04-16: 3000 mL via INTRAVESICAL

## 2017-04-16 MED ORDER — SUGAMMADEX SODIUM 500 MG/5ML IV SOLN
INTRAVENOUS | Status: AC
  Filled 2017-04-16: qty 5

## 2017-04-16 MED ORDER — ACETAMINOPHEN 325 MG PO TABS
650.0000 mg | ORAL_TABLET | ORAL | Status: DC | PRN
  Filled 2017-04-16: qty 2

## 2017-04-16 MED ORDER — PHENYLEPHRINE 40 MCG/ML (10ML) SYRINGE FOR IV PUSH (FOR BLOOD PRESSURE SUPPORT)
PREFILLED_SYRINGE | INTRAVENOUS | Status: AC
  Filled 2017-04-16: qty 10

## 2017-04-16 MED ORDER — MEPERIDINE HCL 25 MG/ML IJ SOLN
6.2500 mg | INTRAMUSCULAR | Status: DC | PRN
  Filled 2017-04-16: qty 1

## 2017-04-16 MED ORDER — BUPIVACAINE HCL (PF) 0.25 % IJ SOLN
INTRAMUSCULAR | Status: DC | PRN
  Administered 2017-04-16: 12 mL

## 2017-04-16 MED ORDER — OXYCODONE-ACETAMINOPHEN 5-325 MG PO TABS
1.0000 | ORAL_TABLET | ORAL | Status: DC | PRN
  Administered 2017-04-17: 1 via ORAL
  Filled 2017-04-16: qty 2

## 2017-04-16 MED ORDER — SODIUM CHLORIDE 0.9% FLUSH
9.0000 mL | INTRAVENOUS | Status: DC | PRN
  Filled 2017-04-16: qty 10

## 2017-04-16 MED ORDER — ONDANSETRON HCL 4 MG/2ML IJ SOLN
INTRAMUSCULAR | Status: DC | PRN
Start: 2017-04-16 — End: 2017-04-16
  Administered 2017-04-16: 4 mg via INTRAVENOUS

## 2017-04-16 MED ORDER — SUGAMMADEX SODIUM 500 MG/5ML IV SOLN
INTRAVENOUS | Status: DC | PRN
  Administered 2017-04-16: 290 mg via INTRAVENOUS

## 2017-04-16 MED ORDER — FENTANYL CITRATE (PF) 100 MCG/2ML IJ SOLN
INTRAMUSCULAR | Status: AC
  Filled 2017-04-16: qty 2

## 2017-04-16 MED ORDER — PHENYLEPHRINE HCL 10 MG/ML IJ SOLN
INTRAMUSCULAR | Status: DC | PRN
  Administered 2017-04-16 (×4): 80 ug via INTRAVENOUS
  Administered 2017-04-16 (×2): 40 ug via INTRAVENOUS
  Administered 2017-04-16: 80 ug via INTRAVENOUS
  Administered 2017-04-16: 40 ug via INTRAVENOUS
  Administered 2017-04-16 (×5): 80 ug via INTRAVENOUS

## 2017-04-16 MED ORDER — LIDOCAINE 2% (20 MG/ML) 5 ML SYRINGE
INTRAMUSCULAR | Status: AC
  Filled 2017-04-16: qty 5

## 2017-04-16 MED ORDER — PROMETHAZINE HCL 25 MG/ML IJ SOLN
6.2500 mg | Freq: Once | INTRAMUSCULAR | Status: AC
  Administered 2017-04-16: 6.25 mg via INTRAVENOUS
  Filled 2017-04-16: qty 1

## 2017-04-16 SURGICAL SUPPLY — 66 items
APPLICATOR ARISTA FLEXITIP XL (MISCELLANEOUS) ×3 IMPLANT
BLADE CLIPPER SURG (BLADE) IMPLANT
CANISTER SUCT 3000ML PPV (MISCELLANEOUS) ×3 IMPLANT
CATH ROBINSON RED A/P 14FR (CATHETERS) ×3 IMPLANT
CATH ROBINSON RED A/P 16FR (CATHETERS) ×6 IMPLANT
CLOTH BEACON ORANGE TIMEOUT ST (SAFETY) ×3 IMPLANT
COVER BACK TABLE 60X90IN (DRAPES) ×3 IMPLANT
COVER MAYO STAND STRL (DRAPES) ×6 IMPLANT
COVER SURGICAL LIGHT HANDLE (MISCELLANEOUS) ×3 IMPLANT
DERMABOND ADVANCED (GAUZE/BANDAGES/DRESSINGS) ×1
DERMABOND ADVANCED .7 DNX12 (GAUZE/BANDAGES/DRESSINGS) ×2 IMPLANT
DRSG COVADERM PLUS 2X2 (GAUZE/BANDAGES/DRESSINGS) ×3 IMPLANT
DRSG OPSITE POSTOP 3X4 (GAUZE/BANDAGES/DRESSINGS) ×3 IMPLANT
ELECT REM PT RETURN 9FT ADLT (ELECTROSURGICAL) ×3
ELECTRODE REM PT RTRN 9FT ADLT (ELECTROSURGICAL) ×2 IMPLANT
FILTER SMOKE EVAC LAPAROSHD (FILTER) IMPLANT
GLOVE BIO SURGEON STRL SZ7 (GLOVE) ×21 IMPLANT
GLOVE BIOGEL PI IND STRL 6.5 (GLOVE) ×2 IMPLANT
GLOVE BIOGEL PI INDICATOR 6.5 (GLOVE) ×1
HEMOSTAT ARISTA ABSORB 3G PWDR (MISCELLANEOUS) ×3 IMPLANT
HOLDER FOLEY CATH W/STRAP (MISCELLANEOUS) ×3 IMPLANT
KIT RM TURNOVER CYSTO AR (KITS) ×3 IMPLANT
LEGGING LITHOTOMY PAIR STRL (DRAPES) ×3 IMPLANT
NEEDLE INSUFFLATION 14GA 120MM (NEEDLE) IMPLANT
NEEDLE INSUFFLATION 14GA 150MM (NEEDLE) IMPLANT
NS IRRIG 500ML POUR BTL (IV SOLUTION) ×3 IMPLANT
PACK LAVH (CUSTOM PROCEDURE TRAY) ×3 IMPLANT
PACK ROBOTIC GOWN (GOWN DISPOSABLE) IMPLANT
PACK TRENDGUARD 450 HYBRID PRO (MISCELLANEOUS) ×2 IMPLANT
PACK TRENDGUARD 600 HYBRD PROC (MISCELLANEOUS) IMPLANT
PAD OB MATERNITY 4.3X12.25 (PERSONAL CARE ITEMS) ×3 IMPLANT
PAD PREP 24X48 CUFFED NSTRL (MISCELLANEOUS) ×3 IMPLANT
POUCH SPECIMEN RETRIEVAL 10MM (ENDOMECHANICALS) ×9 IMPLANT
SCISSORS LAP 5X35 DISP (ENDOMECHANICALS) IMPLANT
SCISSORS LAP 5X45 EPIX DISP (ENDOMECHANICALS) ×3 IMPLANT
SEALER TISSUE G2 CVD JAW 45CM (ENDOMECHANICALS) ×3 IMPLANT
SET IRRIG TUBING LAPAROSCOPIC (IRRIGATION / IRRIGATOR) ×3 IMPLANT
SET IRRIG Y TYPE TUR BLADDER L (SET/KITS/TRAYS/PACK) ×3 IMPLANT
SOLUTION ELECTROLUBE (MISCELLANEOUS) IMPLANT
STRIP CLOSURE SKIN 1/4X4 (GAUZE/BANDAGES/DRESSINGS) IMPLANT
SUT MNCRL AB 4-0 PS2 18 (SUTURE) ×3 IMPLANT
SUT MON AB 2-0 CT1 36 (SUTURE) ×3 IMPLANT
SUT VIC AB 0 CT1 18XCR BRD8 (SUTURE) ×8 IMPLANT
SUT VIC AB 0 CT1 36 (SUTURE) ×6 IMPLANT
SUT VIC AB 0 CT1 8-18 (SUTURE) ×4
SUT VIC AB 2-0 CT1 (SUTURE) IMPLANT
SUT VIC AB 2-0 SH 27 (SUTURE)
SUT VIC AB 2-0 SH 27XBRD (SUTURE) IMPLANT
SUT VIC AB 3-0 SH 27 (SUTURE)
SUT VIC AB 3-0 SH 27X BRD (SUTURE) IMPLANT
SUT VIC AB 4-0 PS2 18 (SUTURE) ×6 IMPLANT
SUT VICRYL 0 UR6 27IN ABS (SUTURE) IMPLANT
SUT VICRYL 1 TIES 12X18 (SUTURE) ×3 IMPLANT
SUT VICRYL 4-0 PS2 18IN ABS (SUTURE) ×3 IMPLANT
SYR BULB IRRIGATION 50ML (SYRINGE) IMPLANT
TOWEL OR 17X24 6PK STRL BLUE (TOWEL DISPOSABLE) ×6 IMPLANT
TRAY FOLEY CATH SILVER 14FR (SET/KITS/TRAYS/PACK) ×3 IMPLANT
TRENDGUARD 450 HYBRID PRO PACK (MISCELLANEOUS) ×3
TRENDGUARD 600 HYBRID PROC PK (MISCELLANEOUS)
TROCAR BALLN 12MMX100 BLUNT (TROCAR) ×3 IMPLANT
TROCAR OPTI TIP 5M 100M (ENDOMECHANICALS) ×3 IMPLANT
TROCAR XCEL 12X100 BLDLESS (ENDOMECHANICALS) IMPLANT
TROCAR XCEL DIL TIP R 11M (ENDOMECHANICALS) ×3 IMPLANT
TUBING INSUF HEATED (TUBING) ×3 IMPLANT
WARMER LAPAROSCOPE (MISCELLANEOUS) ×3 IMPLANT
WATER STERILE IRR 500ML POUR (IV SOLUTION) ×3 IMPLANT

## 2017-04-16 NOTE — Transfer of Care (Signed)
Immediate Anesthesia Transfer of Care Note  Patient: Michele Cole  Procedure(s) Performed: Procedure(s) with comments: LAPAROSCOPIC ASSISTED VAGINAL HYSTERECTOMY WITH SALPINGECTOMY (Bilateral) - NEED BED CYSTOSCOPY  Patient Location: PACU  Anesthesia Type:General  Level of Consciousness: awake, alert  and patient cooperative  Airway & Oxygen Therapy: Patient Spontanous Breathing and Patient connected to face mask oxygen  Post-op Assessment: Report given to RN and Post -op Vital signs reviewed and stable  Post vital signs: Reviewed and stable  Last Vitals:  Vitals:   04/16/17 0603 04/16/17 1058  BP: 113/61   Pulse: 75   Resp: 18   Temp: 37 C (P) 36.6 C  SpO2: 100%     Last Pain:  Vitals:   04/16/17 0603  TempSrc: Oral      Patients Stated Pain Goal: 6 (19/41/74 0814)  Complications: No apparent anesthesia complications

## 2017-04-16 NOTE — Anesthesia Preprocedure Evaluation (Signed)
Anesthesia Evaluation  Patient identified by MRN, date of birth, ID band Patient awake    Reviewed: Allergy & Precautions, NPO status , Patient's Chart, lab work & pertinent test results  Airway Mallampati: II  TM Distance: >3 FB Neck ROM: Full    Dental   Pulmonary    Pulmonary exam normal        Cardiovascular hypertension, Pt. on medications Normal cardiovascular exam     Neuro/Psych Depression    GI/Hepatic   Endo/Other    Renal/GU      Musculoskeletal   Abdominal   Peds  Hematology   Anesthesia Other Findings   Reproductive/Obstetrics                             Anesthesia Physical Anesthesia Plan  ASA: III  Anesthesia Plan: General   Post-op Pain Management:    Induction: Intravenous  PONV Risk Score and Plan: 3 and Ondansetron, Dexamethasone, Midazolam and Treatment may vary due to age or medical condition  Airway Management Planned: Oral ETT  Additional Equipment:   Intra-op Plan:   Post-operative Plan: Extubation in OR  Informed Consent:   Plan Discussed with: CRNA and Surgeon  Anesthesia Plan Comments:         Anesthesia Quick Evaluation

## 2017-04-16 NOTE — Anesthesia Procedure Notes (Signed)
Procedure Name: Intubation Date/Time: 04/16/2017 7:29 AM Performed by: Georgeanne Nim Pre-anesthesia Checklist: Patient identified, Emergency Drugs available, Suction available, Patient being monitored and Timeout performed Patient Re-evaluated:Patient Re-evaluated prior to induction Oxygen Delivery Method: Circle system utilized Preoxygenation: Pre-oxygenation with 100% oxygen Induction Type: IV induction Ventilation: Mask ventilation without difficulty Laryngoscope Size: Mac and 3 Grade View: Grade II Tube type: Oral Tube size: 7.0 mm Number of attempts: 1 Airway Equipment and Method: Stylet and Patient positioned with wedge pillow Placement Confirmation: ETT inserted through vocal cords under direct vision,  positive ETCO2,  CO2 detector and breath sounds checked- equal and bilateral Secured at: 21 cm Tube secured with: Tape Dental Injury: Teeth and Oropharynx as per pre-operative assessment

## 2017-04-16 NOTE — Anesthesia Postprocedure Evaluation (Signed)
Anesthesia Post Note  Patient: Michele Cole  Procedure(s) Performed: Procedure(s) (LRB): LAPAROSCOPIC ASSISTED VAGINAL HYSTERECTOMY WITH SALPINGECTOMY (Bilateral) CYSTOSCOPY     Patient location during evaluation: PACU Anesthesia Type: General Level of consciousness: awake and alert Pain management: pain level controlled Vital Signs Assessment: post-procedure vital signs reviewed and stable Respiratory status: spontaneous breathing, nonlabored ventilation, respiratory function stable and patient connected to nasal cannula oxygen Cardiovascular status: blood pressure returned to baseline and stable Postop Assessment: no signs of nausea or vomiting Anesthetic complications: no    Last Vitals:  Vitals:   04/16/17 1245 04/16/17 1305  BP: 139/74 (!) 157/87  Pulse: 81 83  Resp: (!) 25 16  Temp:  36.6 C  SpO2: 100% 100%    Last Pain:  Vitals:   04/16/17 1305  TempSrc: Oral  PainSc:                  [name] DAVID

## 2017-04-16 NOTE — H&P (Signed)
  History and physical exam unchanged 

## 2017-04-16 NOTE — Progress Notes (Signed)
called Dr Radene Knee about when he wanted patients Foley catheter removed. Dr. Radene Knee to order put in to remove foley in the morning.

## 2017-04-16 NOTE — Op Note (Signed)
Michele Cole, Michele Cole               ACCOUNT NO.:  [id number]  MEDICAL RECORD NO.:  [id number]  LOCATION:                                 FACILITY:  PHYSICIAN:  Darlyn Chamber, M.D.   DATE OF BIRTH:  04-28-1971  DATE OF PROCEDURE:  04/16/2017 DATE OF DISCHARGE:                              OPERATIVE REPORT   PREOPERATIVE DIAGNOSIS:  Uterine fibroids with associated abnormal uterine bleeding.  POSTOPERATIVE DIAGNOSIS:  Uterine fibroids with associated abnormal uterine bleeding.  OPERATIVE PROCEDURE:  Laparoscopic assisted vaginal hysterectomy with removal of both fallopian tubes and right oophorectomy.  Cystoscopy.  SURGEON:  Darlyn Chamber, M.D.  ASSISTANT:  Ralene Bathe. Matthew Saras, M.D.  ANESTHESIA:  General endotracheal.  ESTIMATED BLOOD LOSS:  500 mL.  PACKS:  None.  DRAINS:  Included urethral Foley.  INTRAOPERATIVE BLOOD PLACED:  None.  COMPLICATIONS:  None.  INDICATIONS:  Dictated in history and physical  PROCEDURE IN DETAIL:  The patient was taken to the OR, placed in supine position.  After satisfactory level of general endotracheal anesthesia was obtained, the patient was placed in the dorsal lithotomy position using Allen stirrups.  The perineum and vagina were cleansed with Betadine.  Abdomen was cleaned with sterilized DuraPrep.  The patient's bladder was emptied by in-and-out catheterization.  A speculum was placed in the vaginal vault.  The cervix was grasped with Ardis Hughs tenaculum.  A Hulka tenaculum was put in place, secured.  Single-tooth tenaculum was inspected and removed.  The patient was draped in sterile field.  Subumbilical incision was made with knife and extended through the subcutaneous tissue.  Fascia was identified and entered sharply incision in the fascia extended laterally.  Peritoneum was entered with blunt finger pressure.  Open laparoscopic trocar was inserted and properly deployed.  The abdomen was inflated with carbon  dioxide. Laparoscope was introduced.  There was no evidence of injury to adjacent organs.  No abdominal adhesions were noted.  A 5-mm trocar was put in place in suprapubic area under direct visualization.  Uterus was enlarged, possibly with adenomyosis and/or fibroids.  Both ovaries appeared to be normal.  We first grasped the distal part of the right fallopian tube where she had a previous bilateral tubal ligation.  Using the EnSeal, the mesenteric attachments of the tubes were cauterized and incised.  The distal part of tube was removed through the subumbilical port, sent to Pathology.  The left tube had a tubal ligation, but it is more intact.  It was elevated.  Using the EnSeal, the mesenteric attachments of the tube were separated up to the uterus.  We then went to the right side.  The right utero-ovarian pedicle was cauterized.  The right round ligament was cauterized and incised.  Moved to the left side, left utero-ovarian pedicle was cauterized, incised, and the left round ligament was cauterized and incised.  At this point in time, we decided to go vaginally.  The laparoscope was removed and abdomen was deflated with carbon dioxide.  Legs were repositioned.  The Hulka tenaculum removed.  Weighted speculum placed in the vaginal vault. Cervix was grasped with a Ardis Hughs tenaculum.  Cul-de-sac was entered sharply.  Both uterosacral  ligaments were clamped, cut, suture ligated with 0 Vicryl.  Reflection of vaginal mucosa anteriorly was incised and the bladder was dissected superiorly.  Paracervical tissue was then clamped, cut, and suture ligated with 0 Vicryl.  We continued to advance the bladder superiorly.  Uterus was enlarged.  We continued to clamp, cut, and tie technique using suture ligatures of 0 Vicryl until we got up to where we could not go any farther.  We then excised the cervical stump.  Then, we began to morcellate the uterus.  After further morcellation, we were able to  deliver the fundus through the vaginal opening.  It looked like the bladder flap had been already developed. There were some thick tissues to the right side.  This was clamped and cut and secured with free ties of 0 Vicryl and suture ligature of 0 Vicryl.  The fundus was then removed and all sent for Pathology.  At this point in time, we ran the posterior cuff with a running lock suture of 0 Vicryl.  Areas of bleeding were controlled with figure-of-eight 0 Vicryl.  Vaginal mucosa was then reapproximated with interrupted figure- of-eights of 0 Vicryl.  Bladder was emptied by in-and-out catheterization.  Urine was clear and adequate.  Some areas of vaginal lacerations were retracted and cauterized.  A sponge stick was then placed in the vaginal vault.  Legs were repositioned.  Laparoscope was re-introduced.  The abdomen is re-inflated with carbon dioxide. Visualization in the right ovary was lacerated and it looked like it was bleeding.  I felt at this point in time, it was best to remove the ovary.  The ovary was elevated.  The ovarian vasculature was easily identified and using the EnSeal was cauterized and incised.  We cauterized and incised the mesenteric attachments of the ovary, and it was completely released.  At this point in time, we removed the 5 mm trocar in the suprapubic area and put in a 10/11 trocar and a 5-mm trocar was put in the left lower quadrant after visualization of the epigastric vessels.  We then brought in the endobag.  The ovary was placed in the endobag and eventually brought out through pieces through the suprapubic incision.  Actually, the first bag did break.  We were able to remove the distal plastic part.  We brought in another endobag and we used scissors to make the ovary thinner and put it in the endobag and then proceeded to remove it through the suprapubic incision.  A 10/11 trocar was put back in place.  There was a thickened area to the left side and  it looked like part of the uterine fundus.  We continued with the bladder flap attach.  We first using scissors took down the bladder flap.  Using the EnSeal, we cauterized and incised the attachments of the fundus to the vaginal cuff.  We removed this with an endobag through the suprapubic area.  At this point in time, we irrigated the pelvis thoroughly.  We first looked at the left ovary, it was hemostatic and intact.  There was some oozing from the vaginal cuff, brought in control with the bipolar.  The ovarian vasculature on the right was hemostatic intact.  We irrigated and had good hemostasis.  At this point in time, we then put Arista on the vaginal cuff.  We had good hemostasis after being placed in carbon dioxide.  All trocars were removed.  Subumbilical fascia was closed with 2 figure-of-eights of 0 Vicryl.  Skin  was closed with interrupted subcuticulars of 4-0 Vicryl. Suprapubic incision was closed with 4-0 Vicryl and Dermabond.  The left lower quadrant incision was closed with Dermabond.  The patient's legs were repositioned.  The cystoscope was introduced.  There was no evidence of injury to the bladder.  Both ureters were identified just as the clear urine was seen coming from each ureteral orifice.  The cystoscope was removed.  Foley was placed straight drain.  Rectal exam was unremarkable.  At this point in time, the patient was taken out of the dorsal lithotomy position. Once alert, extubated and transferred to recovery room.  Sponge, instrument, and needle count was correct by circulating nurse multiple times.     Darlyn Chamber, M.D.     JSM/MEDQ  D:  04/16/2017  T:  04/16/2017  Job:  [id number]

## 2017-04-16 NOTE — Brief Op Note (Signed)
04/16/2017  10:48 AM  PATIENT:  Gillie Manners  46 y.o. female  PRE-OPERATIVE DIAGNOSIS:  PELVIC PAIN  POST-OPERATIVE DIAGNOSIS:  PELVIC PAIN  PROCEDURE:  Procedure(s) with comments: LAPAROSCOPIC ASSISTED VAGINAL HYSTERECTOMY WITH SALPINGECTOMY (Bilateral) - NEED BED CYSTOSCOPY  SURGEON:  Surgeon(s) and Role:    * Shanikqua Zarzycki, MD - Primary    * Molli Posey, MD - Assisting   PHYSICIAN ASSISTANT:   ASSISTANTS: holland   ANESTHESIA:   local and general  EBL:  Total I/O In: 2000 [I.V.:2000] Out: 800 [Urine:300; Blood:500]  BLOOD ADMINISTERED:none  DRAINS: Urinary Catheter (Foley)   LOCAL MEDICATIONS USED:  MARCAINE     SPECIMEN:  Source of Specimen:  uterus both tubes and right ovary  DISPOSITION OF SPECIMEN:  PATHOLOGY  COUNTS:  YES  TOURNIQUET:  * No tourniquets in log *  DICTATION: .Other Dictation: Dictation Number [id number]  PLAN OF CARE: Admit for overnight observation  PATIENT DISPOSITION:  PACU - hemodynamically stable.   Delay start of Pharmacological VTE agent (>24hrs) due to surgical blood loss or risk of bleeding: not applicable

## 2017-04-16 NOTE — Progress Notes (Signed)
Patient ID: Michele Cole, female   DOB: 06-21-1971, 46 y.o.   MRN: [id number]          AFF VSS C/O NAUSEA ABD SOFT GOOD UO NO BLEEDING

## 2017-04-17 ENCOUNTER — Encounter (HOSPITAL_BASED_OUTPATIENT_CLINIC_OR_DEPARTMENT_OTHER): Payer: Self-pay | Admitting: Obstetrics and Gynecology

## 2017-04-17 DIAGNOSIS — N8311 Corpus luteum cyst of right ovary: Secondary | ICD-10-CM | POA: Diagnosis not present

## 2017-04-17 LAB — CBC
HCT: 37.6 % (ref 36.0–46.0)
HEMOGLOBIN: 12.8 g/dL (ref 12.0–15.0)
MCH: 26.9 pg (ref 26.0–34.0)
MCHC: 34 g/dL (ref 30.0–36.0)
MCV: 79 fL (ref 78.0–100.0)
Platelets: 328 10*3/uL (ref 150–400)
RBC: 4.76 MIL/uL (ref 3.87–5.11)
RDW: 13.4 % (ref 11.5–15.5)
WBC: 12 10*3/uL — ABNORMAL HIGH (ref 4.0–10.5)

## 2017-04-17 MED ORDER — ONDANSETRON HCL 4 MG/2ML IJ SOLN
INTRAMUSCULAR | Status: AC
  Filled 2017-04-17: qty 2

## 2017-04-17 MED ORDER — PROMETHAZINE HCL 12.5 MG PO TABS: 12.5000 mg | ORAL_TABLET | Freq: Four times a day (QID) | ORAL | 0 refills | Status: AC | PRN

## 2017-04-17 MED ORDER — OXYCODONE-ACETAMINOPHEN 5-325 MG PO TABS
ORAL_TABLET | ORAL | Status: AC
  Filled 2017-04-17: qty 1

## 2017-04-17 NOTE — Discharge Summary (Signed)
Patient Michele Cole, Michele Cole DICTATION# [id number] CSN# [id number]  Darlyn Chamber, MD 04/17/2017 1:47 PM

## 2017-04-17 NOTE — Progress Notes (Signed)
1 Day Post-Op Procedure(s) (LRB): LAPAROSCOPIC ASSISTED VAGINAL HYSTERECTOMY WITH SALPINGECTOMY (Bilateral) CYSTOSCOPY  Subjective: Patient reports nausea and no problems voiding.    Objective: I have reviewed patient's vital signs, intake and output and labs.  General: alert GI: soft, non-tender; bowel sounds normal; no masses,  no organomegaly Vaginal Bleeding: minimal  Assessment: s/p Procedure(s) with comments: LAPAROSCOPIC ASSISTED VAGINAL HYSTERECTOMY WITH SALPINGECTOMY (Bilateral) - NEED BED CYSTOSCOPY: stable  Plan: Encourage ambulation Advance to PO medication  LOS: 0 days    Karyss Frese S 04/17/2017, 8:02 AM

## 2017-04-17 NOTE — Progress Notes (Signed)
Patient ID: Michele Cole, female   DOB: 1970-09-30, 46 y.o.   MRN: [id number] Tolerating po's and ambulating Had d/ced iv and foley Will discharge

## 2017-04-17 NOTE — Progress Notes (Signed)
Wasting 16 mg of 25 mg PCA Dilaudid. Witnessed by Trude Mcburney, RN and  Janey Greaser, RN

## 2017-04-17 NOTE — Discharge Instructions (Signed)

## 2017-04-18 NOTE — Discharge Summary (Signed)
NAME:  Michele Cole, Michele Cole                    ACCOUNT NO.:  MEDICAL RECORD NO.:  [id number]  LOCATION:                                 FACILITY:  PHYSICIAN:  Darlyn Chamber, M.D.   DATE OF BIRTH:  [birth year]  DATE OF ADMISSION:  04/17/2017 DATE OF DISCHARGE:  04/17/2017                              DISCHARGE SUMMARY   ADMITTING DIAGNOSIS:  __________ she was subsequently transferred to postop management.  Postoperatively, she did fairly well that afternoon.  She did have nausea and did not really feel well.  By the following morning, that had gotten better.  We observed her that morning.  She was eventually able to tolerate diet.  Her IVs were discontinued.  Her Foley had been discontinued, she was voiding without difficulty.  Her abdomen was soft. Bowel sounds were active.  She was passing flatus __________.  Her hemoglobin was 12.8, it had been 12.9 before.  She is going to be discharged home at this time.  In terms of complications, none were encountered during her stay in the hospital.  The patient was discharged home in stable condition.  DISPOSITION:  The patient is to avoid heavy lifting or vaginal entrance. She should be careful while driving the car.  DISCHARGE INSTRUCTIONS:  She was instructed to call should there be signs of heavy vaginal bleeding.  Also instructed signs and symptoms of deep venous thrombosis and pulmonary embolus.  Discharged home on Percocet as needed for pain and Phenergan as needed for nausea.  Follow up will be arranged in the office.     Darlyn Chamber, M.D.     JSM/MEDQ  D:  04/17/2017  T:  04/17/2017  Job:  [id number]

## 2018-01-05 IMAGING — CR DG CHEST 2V
2 series · 2 of 2 positions shown · non-contrast
Comparison: 09/19/2008, 10/22/2006, 08/03/2003.

CLINICAL DATA: 45-year-old with acute onset of shortness of breath
approximately 3 hours prior to emergency department admission. She
also has acute low back pain with left lower extremity sciatica
which began 2 days ago.

EXAM:
CHEST  2 VIEW

[w chest pa]
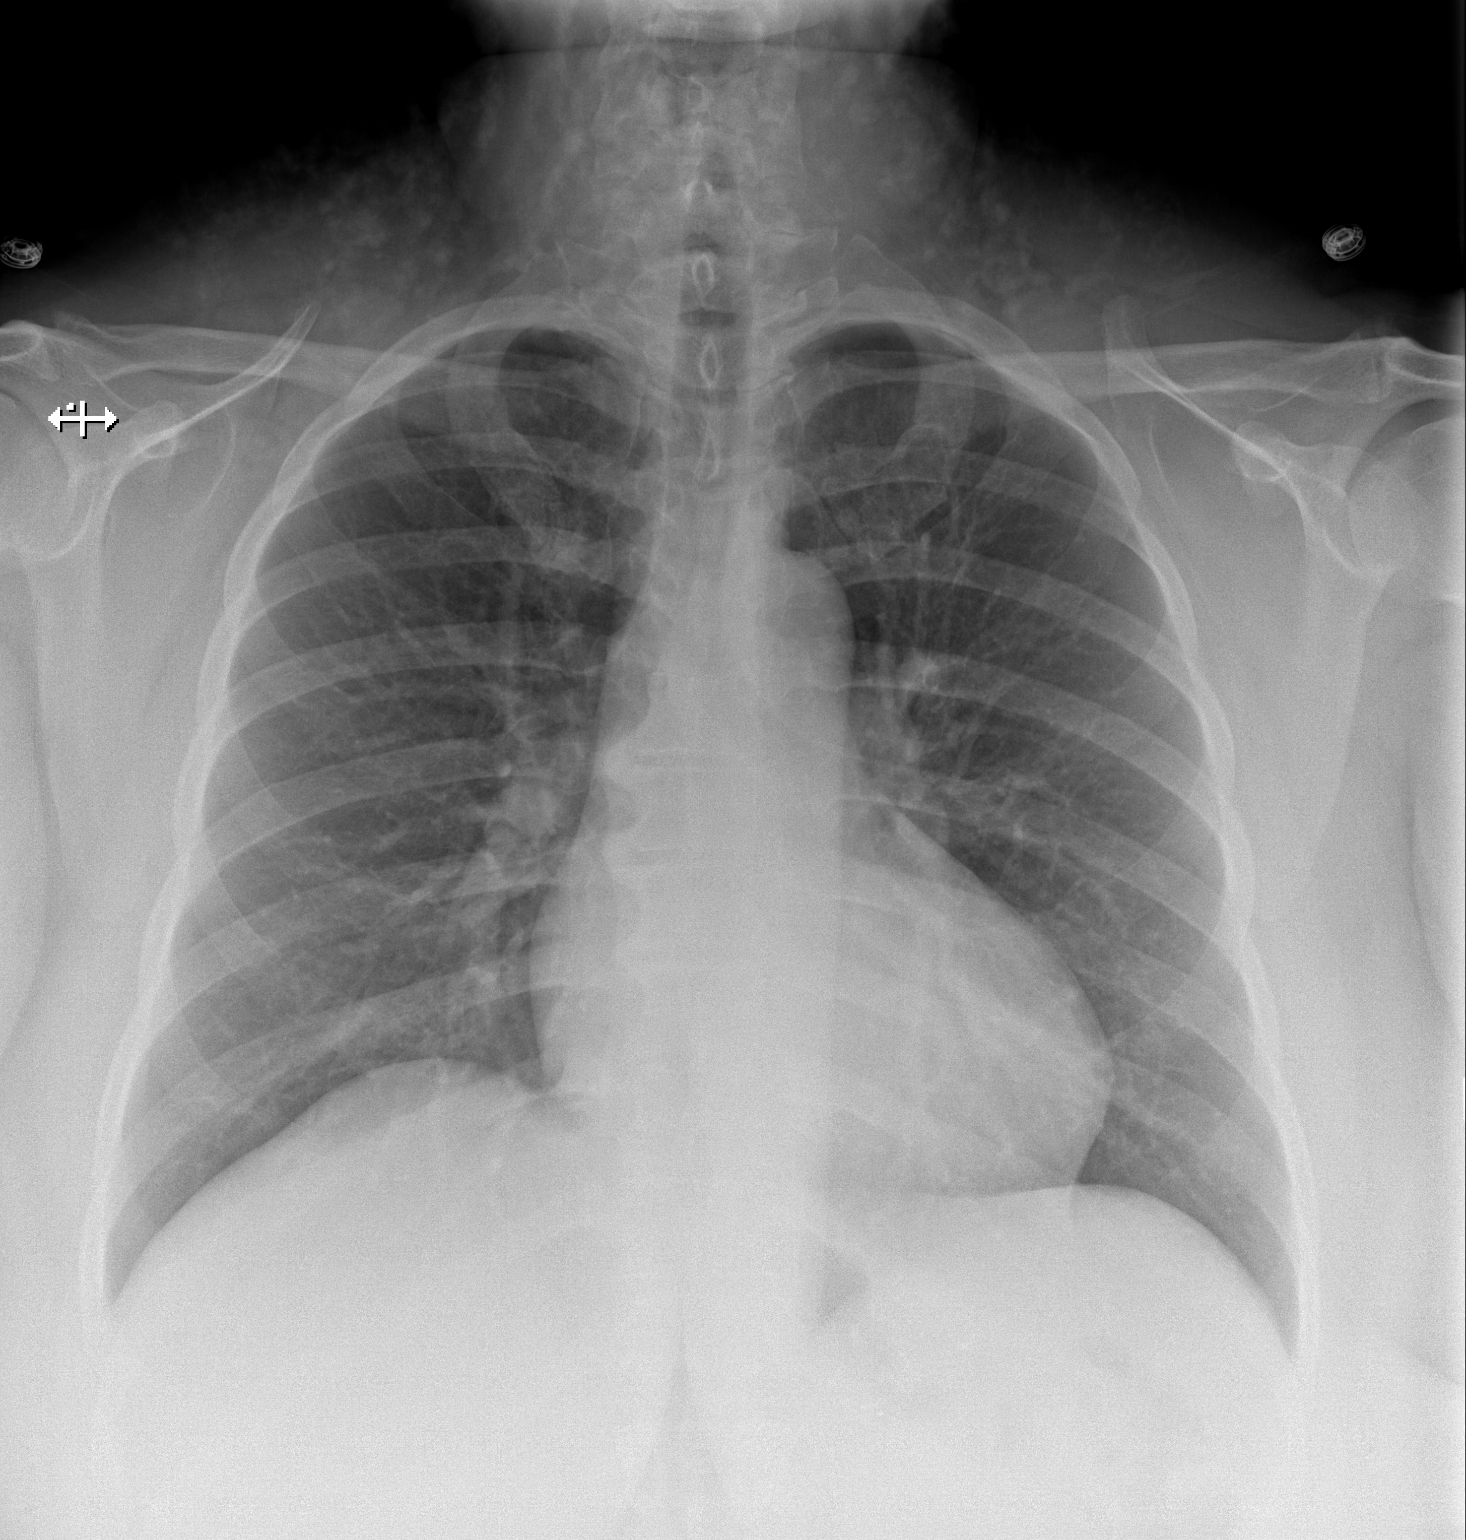

[w chest lat]
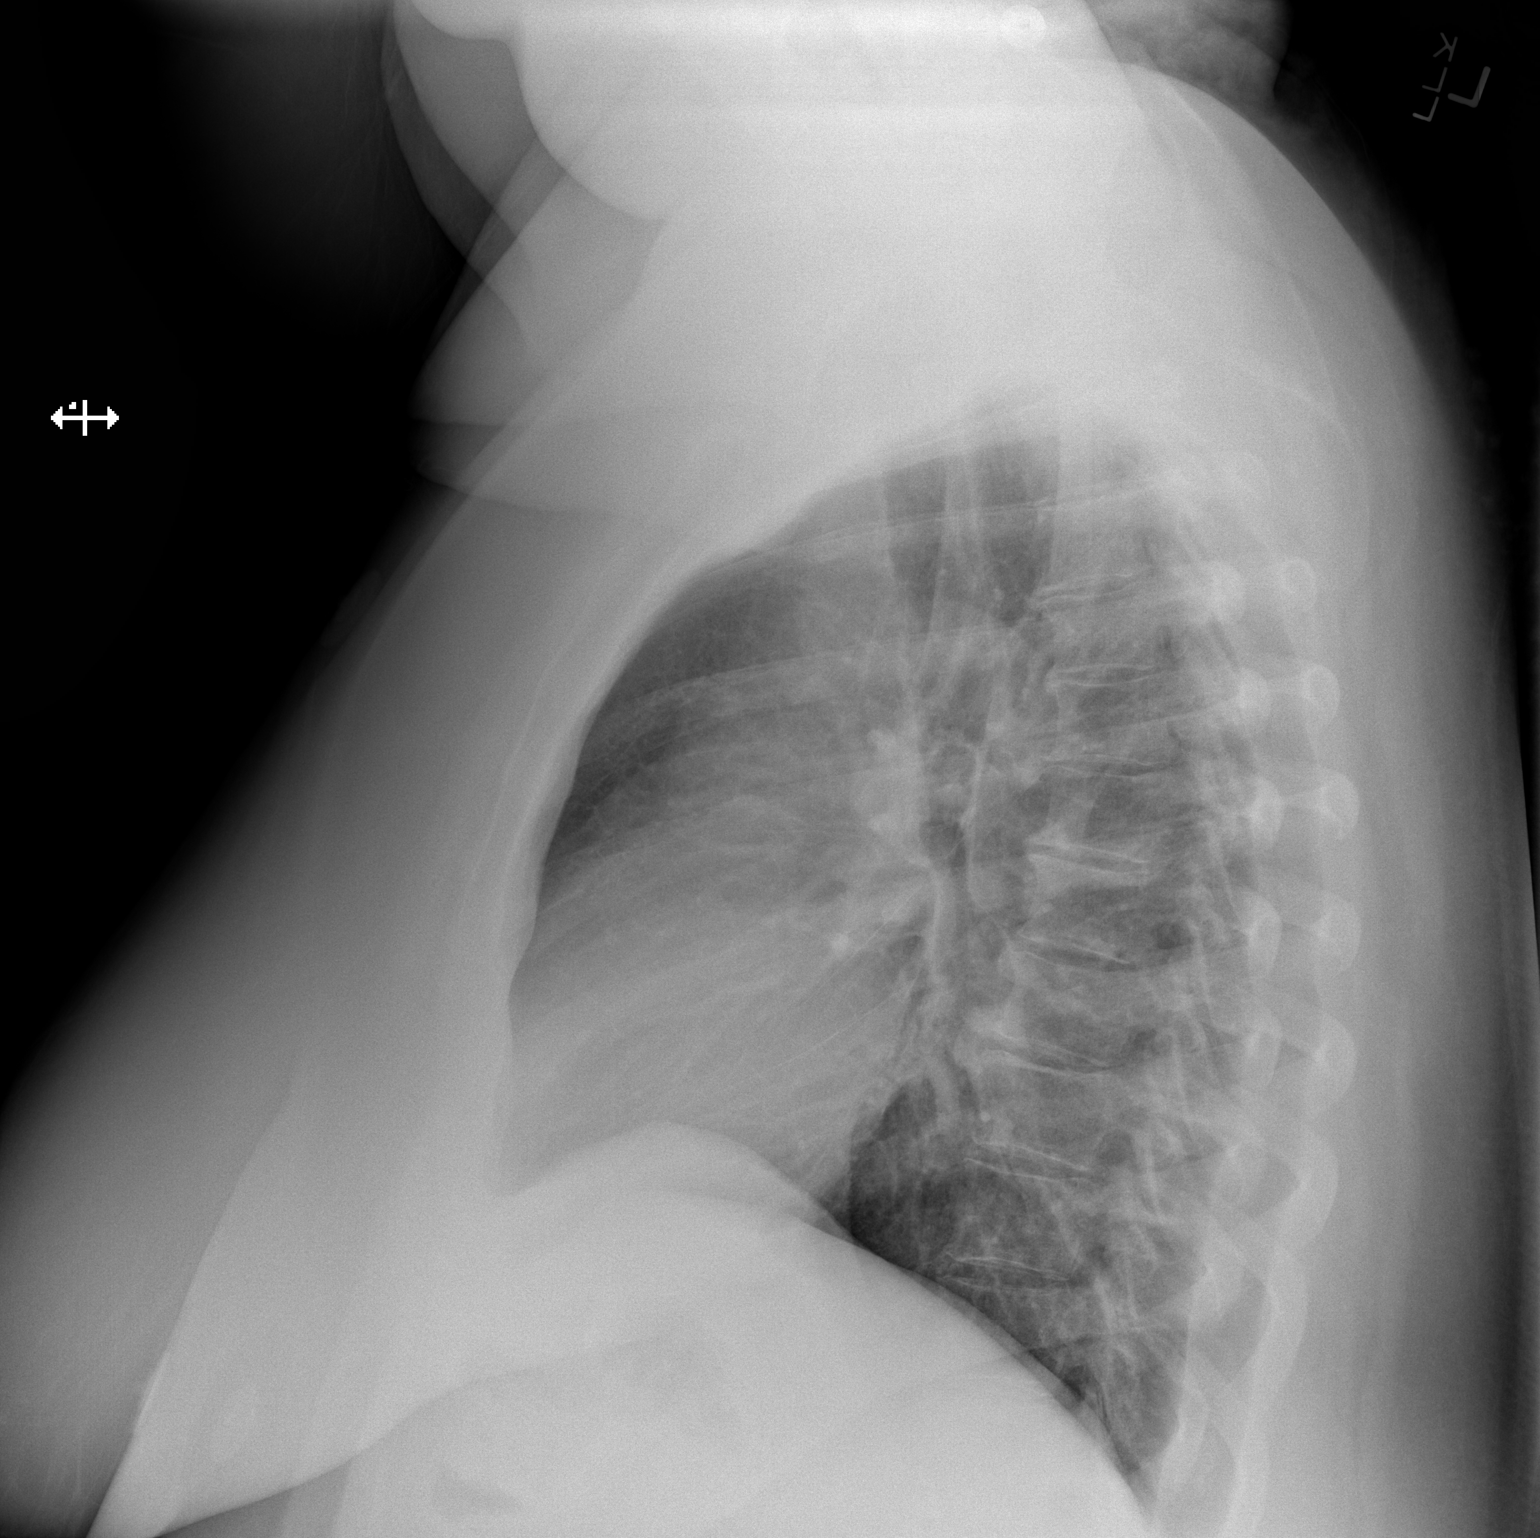

[2 of 2 positions shown; findings below may reference images not displayed]

FINDINGS: Cardiomediastinal silhouette unremarkable, unchanged. Lungs clear.
Bronchovascular markings normal. Pulmonary vascularity normal. No
visible pleural effusions. No pneumothorax. Degenerative changes
involving the thoracic spine.
IMPRESSION: No acute cardiopulmonary disease.
